# Patient Record
Sex: Female | Born: 1980 | Race: White | Hispanic: No | Marital: Married | Smoking: Former smoker
Health system: Southern US, Community
[De-identification: ages and names within clinical notes are randomized; demographics above are authoritative.]

## PROBLEM LIST (undated history)

## (undated) DIAGNOSIS — R87619 Unspecified abnormal cytological findings in specimens from cervix uteri: Secondary | ICD-10-CM

## (undated) DIAGNOSIS — R51 Headache: Secondary | ICD-10-CM

## (undated) DIAGNOSIS — F419 Anxiety disorder, unspecified: Secondary | ICD-10-CM

## (undated) DIAGNOSIS — J189 Pneumonia, unspecified organism: Secondary | ICD-10-CM

## (undated) DIAGNOSIS — I499 Cardiac arrhythmia, unspecified: Secondary | ICD-10-CM

## (undated) DIAGNOSIS — R519 Headache, unspecified: Secondary | ICD-10-CM

## (undated) HISTORY — DX: Unspecified abnormal cytological findings in specimens from cervix uteri: R87.619

---

## 2008-12-10 ENCOUNTER — Ambulatory Visit: Payer: Self-pay | Admitting: Unknown Physician Specialty

## 2009-12-10 ENCOUNTER — Observation Stay: Payer: Self-pay

## 2009-12-11 ENCOUNTER — Inpatient Hospital Stay: Payer: Self-pay | Admitting: Obstetrics and Gynecology

## 2012-03-07 ENCOUNTER — Ambulatory Visit: Payer: Self-pay | Admitting: Obstetrics and Gynecology

## 2012-06-17 ENCOUNTER — Ambulatory Visit: Payer: Self-pay | Admitting: Obstetrics and Gynecology

## 2012-06-17 LAB — CBC WITH DIFFERENTIAL/PLATELET
Basophil %: 0.3 %
Eosinophil #: 0.1 10*3/uL (ref 0.0–0.7)
Eosinophil %: 0.7 %
HCT: 36.4 % (ref 35.0–47.0)
HGB: 12.7 g/dL (ref 12.0–16.0)
MCHC: 34.9 g/dL (ref 32.0–36.0)
MCV: 94 fL (ref 80–100)
Neutrophil %: 80 %
Platelet: 169 10*3/uL (ref 150–440)
WBC: 9.6 10*3/uL (ref 3.6–11.0)

## 2012-06-18 ENCOUNTER — Inpatient Hospital Stay: Payer: Self-pay | Admitting: Obstetrics and Gynecology

## 2012-06-19 LAB — HEMATOCRIT: HCT: 28.3 % — ABNORMAL LOW (ref 35.0–47.0)

## 2013-03-28 ENCOUNTER — Ambulatory Visit: Payer: Self-pay | Admitting: Physician Assistant

## 2014-04-27 ENCOUNTER — Ambulatory Visit: Payer: Self-pay | Admitting: Obstetrics and Gynecology

## 2014-05-06 ENCOUNTER — Ambulatory Visit: Payer: Self-pay | Admitting: Obstetrics and Gynecology

## 2014-07-10 NOTE — Op Note (Signed)
PATIENT NAME:  Christie Cannon, Christie Cannon MR#:  [id number] DATE OF BIRTH:  05/16/1980  DATE OF SURGERY:  06/18/2012  PREOPERATIVE DIAGNOSES: 1.  Intrauterine pregnancy at 39 weeks' gestational age.  2.  History of perineal trauma, with fourth-degree laceration with prior delivery.  3.  Desires primary repeat cesarean section.   POSTOPERATIVE DIAGNOSES: 1.  Intrauterine pregnancy at [redacted] weeks gestational age.  2.  History of perineal trauma, with fourth-degree laceration with prior delivery.  3.  Desires primary repeat cesarean section.   PROCEDURES: Primary low transverse cesarean section via Pfannenstiel incision.   ANESTHESIA: Spinal.  SURGEON: Stephen Jackson, M.D.   ASSISTANT SURGEON: Paul Harris, M.D.   ESTIMATED BLOOD LOSS: 750 mL.   OPERATIVE FLUIDS: 1100 mL crystalloid.   COMPLICATIONS: None.   FINDINGS: 1.  Normal-appearing gravid uterus, fallopian tubes, and ovaries.  2.  Viable female infant with Apgars of 9 and 9 at 1 and 5 minutes, respectively.   SPECIMENS: None.   CONDITION AT THE END OF PROCEDURE: Stable.   INDICATIONS FOR PROCEDURE: The patient is a 34-year-old gravida 2, para 1-0-0-1 female at 39 weeks' gestational age who has a history of perineal trauma with a fourth-degree laceration with delivery of her first child. During her pregnancy, she was counseled regarding attempted vaginal delivery with her history of perineal trauma versus offering a primary cesarean section. After deliberation and much counseling, the patient elected to undergo a primary cesarean section. For this, she was taken to the operating room for abdominal delivery.   PROCEDURE IN DETAIL: The patient was met in the preoperative area and the procedure was reviewed and her questions were answered. She was taken to the operating room. She was placed under spinal anesthesia, which was found to be adequate. She was placed in the dorsal supine position with a leftward tilt and then prepped and draped in the  usual sterile fashion. After a time-out was called, a Pfannenstiel incision was made and carried through the various layers until the peritoneum was reached and entered sharply. After extending the peritoneal opening, a bladder flap was created and a bladder blade was placed to pull the bladder out of the operative area of interest. A low transverse incision was made on the uterus and extended laterally with both cranial and caudal tension, and the fetal vertex was then grasped and elevated to the hysterotomy. The bladder blade was removed and then the head, followed by the shoulders and the rest of the body were delivered without difficulty. The cord was then clamped and cut and the infant was handed off to the awaiting pediatrician. The cord blood was obtained. The placenta was then removed and the uterus was exteriorized and cleared of all clots and debris. The hysterotomy was closed using #0 Vicryl in a running, locked fashion. A second layer of the same suture was used to obtain hemostasis. The uterus was returned to the abdomen and the gutters were cleared of all clots and debris. After hemostasis was assured, the rectus muscles were reapproximated in the midline with a single, loose figure-of-eight suture.   Next, the On-Q pump pain system was then placed according to the manufacturer's recommendations. The 2 catheters were placed approximately 3.5 and 4 cm cephalad to the incision line, respectively. They were inserted to a level of about 3-1/2 marks along the catheter, and they were placed just superficial to the rectus abdominal muscles and just deep to the rectus fascia.   The fascia was then closed with #0 Maxon   using 2 sutures, each starting at the opposite apices and meeting in the midline, where they were tied off. Extra care was taken not to include the On-Q catheters in the closure of the fascia. The subcutaneous tissue was irrigated copiously and hemostasis was assured. The skin was  reapproximated using the Insorb subcutaneous stapler and then Steri-Strips as well as benzoin were placed to reinforce this closure.   Attention was turned to the On-Q pump where the catheters were affixed to the skin using Dermabond; and after this dried, the catheters were affixed to the skin using Steri-Strips as well as Tegaderm. Each catheter was bolused using 5 mL of 0.5% Marcaine plain for a total of 10 mL injected for bolus.   The patient tolerated the procedure well. Sponge, lap, and needle counts were correct x2. For an antibiotic, she received 2 grams of Ancef prior to skin incision. For VTE prophylaxis, the patient was wearing pneumatic compression stockings throughout the entire procedure, which were on and operating. She was taken to the recovery room in stable condition.      ____________________________ Stephen D. Jackson, MD sdj:dm D: 06/18/2012 10:30:00 ET T: 06/18/2012 10:44:14 ET JOB#: [id number]  cc: Stephen D. Jackson, MD, <Dictator> STEPHEN D JACKSON MD ELECTRONICALLY SIGNED 07/18/2012 8:23 

## 2014-11-19 LAB — HM PAP SMEAR: HM Pap smear: NEGATIVE

## 2015-04-01 ENCOUNTER — Encounter: Payer: Self-pay | Admitting: *Deleted

## 2015-04-14 ENCOUNTER — Ambulatory Visit (INDEPENDENT_AMBULATORY_CARE_PROVIDER_SITE_OTHER): Payer: BC Managed Care – PPO | Admitting: General Surgery

## 2015-04-14 ENCOUNTER — Encounter: Payer: Self-pay | Admitting: General Surgery

## 2015-04-14 VITALS — BP 100/60 | HR 88 | Resp 12 | Ht 66.0 in | Wt 159.0 lb

## 2015-04-14 DIAGNOSIS — K4090 Unilateral inguinal hernia, without obstruction or gangrene, not specified as recurrent: Secondary | ICD-10-CM | POA: Diagnosis not present

## 2015-04-14 DIAGNOSIS — Z331 Pregnant state, incidental: Secondary | ICD-10-CM

## 2015-04-14 DIAGNOSIS — Z3490 Encounter for supervision of normal pregnancy, unspecified, unspecified trimester: Secondary | ICD-10-CM

## 2015-04-14 NOTE — Progress Notes (Signed)
Patient ID: Christie Cannon, female   DOB: Jun 22, 1980, 35 y.o.   MRN: [id number]  Chief Complaint  Patient presents with  . Abdominal Pain    HPI Christie Cannon is a 35 y.o. female.  Here today for evaluation of left groin swelling since the first part of the month. She did notice some left abdominal pain since last year. CT abdomen was 05-06-14. The pain can last all day. Ice pack does help. She is currently [redacted] weeks pregnant. Tentative C-section is April 27th. She is here today with her husband. Bowels are regular and daily, she does notice occasionally diarrhea, lasting 2-3 days, but that has been for about a year and correlates to her menstrual cycle. The patient suffered a fourth degree tear with her first pregnancy. Elective C-section with the second. Elective C-section planned with the third.    I personally reviewed the patient's history.   HPI  No past medical history on file.  Past Surgical History  Procedure Laterality Date  . Cesarean section  2014    No family history on file.  Social History Social History  Substance Use Topics  . Smoking status: Former Smoker -- 1.00 packs/day for 5 years    Types: Cigarettes  . Smokeless tobacco: Never Used  . Alcohol Use: 0.0 oz/week    0 Standard drinks or equivalent per week    Allergies  Allergen Reactions  . Penicillins Rash    Current Outpatient Prescriptions  Medication Sig Dispense Refill  . Prenatal Multivit-Min-Fe-FA (PRE-NATAL PO) Take by mouth.     No current facility-administered medications for this visit.    Review of Systems Review of Systems  Constitutional: Negative.   Respiratory: Negative.   Cardiovascular: Negative.   Gastrointestinal: Positive for abdominal pain. Negative for constipation.    Blood pressure 100/60, pulse 88, resp. rate 12, height  (1.676 m), weight 159 lb (72.122 kg).  Physical Exam Physical Exam  Constitutional: She is oriented to person, place, and time. She appears  well-developed and well-nourished.  HENT:  Mouth/Throat: Oropharynx is clear and moist.  Eyes: Conjunctivae are normal. No scleral icterus.  Neck: Neck supple.  Cardiovascular: Normal rate, regular rhythm and normal heart sounds.   Pulses:      Femoral pulses are 2+ on the right side, and 2+ on the left side. Pulmonary/Chest: Effort normal and breath sounds normal.  Abdominal: A hernia is present. Hernia confirmed positive in the left inguinal area.    Tender left groin  Lymphadenopathy:    She has no cervical adenopathy.  Neurological: She is alert and oriented to person, place, and time.  Skin: Skin is warm and dry.  Psychiatric: Her behavior is normal.    Data Reviewed The February 2016 CT scan was independently reviewed. No left lower quadrant findings noted. No evidence of hernia at that time. REASON FOR EXAM:    LLQ Abd and Pelvic Pain Radiating to Back COMMENTS: PROCEDURE:     KCT - KCT ABDOMEN/PELVIS W  - May 06 2014  3:44PM CLINICAL DATA:  Intermittent left lower quadrant pain EXAM: CT ABDOMEN AND PELVIS WITH CONTRAST TECHNIQUE: Multidetector CT imaging of the abdomen and pelvis was performed using the standard protocol following bolus administration of intravenous contrast. CONTRAST:  85 cc of Omnipaque 300 COMPARISON:  None. FINDINGS: Lower chest: No pleural or pericardial effusion noted. The lung bases appear clear. Hepatobiliary: No suspicious liver abnormalities identified. The gallbladder appears normal. There is no biliary dilatation. Pancreas: Negative Spleen:  The spleen appears normal. Adrenals/Urinary Tract: The adrenal glands are normal. Normal appearance of the left kidney. The right kidney is also normal. Urinary bladder appears within normal limits. Stomach/Bowel: The stomach is within normal limits. The small bowel loops have a normal course and caliber. No obstruction. Normal appearance of the colon. The appendix is visualized and is  within normal limits. Vascular/Lymphatic: Normal appearance of the abdominal aorta. No enlarged retroperitoneal or mesenteric adenopathy. No enlarged pelvic or inguinal lymph nodes. Reproductive: The uterus appears normal. Corpus luteal cyst is identified in the left ovary. Other: There is no ascites or focal fluid collections within the abdomen or pelvis. Musculoskeletal: The visualized osseous structures are on unremarkable. IMPRESSION: 1. No acute findings. 2. No explanation for left lower quadrant pain and back pain.  Office notes of 04/01/2015 from Thomasene Mohair, M.D. Reviewed.     Assessment    Left inguinal hernia, mildly symptomatic.    Plan    Indications for elective repair were reviewed. While she is at the end of her second trimester, she is minimally symptomatic and as each week of her pregnancy goes on she is less likely have difficulty with bowel or omentum becoming trapped in the defect. I would like to avoid surgery while pregnant if possible.  If I am in town when her scheduled C-section is being planned, I would like to come in to the OR and assess the area. This may be a matter of a simple suture at the internal ring without mesh reinforcement. I would be reluctant to place mesh at the time of C-section.  If intraoperative assessment or repair during the C-section is not appropriate, would plan for a outpatient procedure 6-8 weeks after delivery.    Hernia precautions and incarceration were discussed with the patient. If they develop symptoms of an incarcerated hernia, they were encouraged to seek prompt medical attention.  I have recommended repair of the hernia using mesh on an outpatient basis in the near future. The risk of infection was reviewed. The role of prosthetic mesh to minimize the risk of recurrence was reviewed.    PCP:  No Pcp Per Patient Ref: Dr Thomasene Mohair  This information has been scribed by Dorathy Daft RNBC.   Earline Mayotte 04/15/2015, 4:48 PM

## 2015-04-14 NOTE — Patient Instructions (Signed)
The patient is aware to call back for any questions or concerns. Inguinal Hernia, Adult Muscles help keep everything in the body in its proper place. But if a weak spot in the muscles develops, something can poke through. That is called a hernia. When this happens in the lower part of the belly (abdomen), it is called an inguinal hernia. (It takes its name from a part of the body in this region called the inguinal canal.) A weak spot in the wall of muscles lets some fat or part of the small intestine bulge through. An inguinal hernia can develop at any age. Men get them more often than women. CAUSES  In adults, an inguinal hernia develops over time.  It can be triggered by:  Suddenly straining the muscles of the lower abdomen.  Lifting heavy objects.  Straining to have a bowel movement. Difficult bowel movements (constipation) can lead to this.  Constant coughing. This may be caused by smoking or lung disease.  Being overweight.  Being pregnant.  Working at a job that requires long periods of standing or heavy lifting.  Having had an inguinal hernia before. One type can be an emergency situation. It is called a strangulated inguinal hernia. It develops if part of the small intestine slips through the weak spot and cannot get back into the abdomen. The blood supply can be cut off. If that happens, part of the intestine may die. This situation requires emergency surgery. SYMPTOMS  Often, a small inguinal hernia has no symptoms. It is found when a healthcare provider does a physical exam. Larger hernias usually have symptoms.   In adults, symptoms may include:  A lump in the groin. This is easier to see when the person is standing. It might disappear when lying down.  In men, a lump in the scrotum.  Pain or burning in the groin. This occurs especially when lifting, straining or coughing.  A dull ache or feeling of pressure in the groin.  Signs of a strangulated hernia can  include:  A bulge in the groin that becomes very painful and tender to the touch.  A bulge that turns red or purple.  Fever, nausea and vomiting.  Inability to have a bowel movement or to pass gas. DIAGNOSIS  To decide if you have an inguinal hernia, a healthcare provider will probably do a physical examination.  This will include asking questions about any symptoms you have noticed.  The healthcare provider might feel the groin area and ask you to cough. If an inguinal hernia is felt, the healthcare provider may try to slide it back into the abdomen.  Usually no other tests are needed. TREATMENT  Treatments can vary. The size of the hernia makes a difference. Options include:  Watchful waiting. This is often suggested if the hernia is small and you have had no symptoms.  No medical procedure will be done unless symptoms develop.  You will need to watch closely for symptoms. If any occur, contact your healthcare provider right away.  Surgery. This is used if the hernia is larger or you have symptoms.  Open surgery. This is usually an outpatient procedure (you will not stay overnight in a hospital). An cut (incision) is made through the skin in the groin. The hernia is put back inside the abdomen. The weak area in the muscles is then repaired by herniorrhaphy or hernioplasty. Herniorrhaphy: in this type of surgery, the weak muscles are sewn back together. Hernioplasty: a patch or mesh is   used to close the weak area in the abdominal wall.  Laparoscopy. In this procedure, a surgeon makes small incisions. A thin tube with a tiny video camera (called a laparoscope) is put into the abdomen. The surgeon repairs the hernia with mesh by looking with the video camera and using two long instruments. HOME CARE INSTRUCTIONS   After surgery to repair an inguinal hernia:  You will need to take pain medicine prescribed by your healthcare provider. Follow all directions carefully.  You will need  to take care of the wound from the incision.  Your activity will be restricted for awhile. This will probably include no heavy lifting for several weeks. You also should not do anything too active for a few weeks. When you can return to work will depend on the type of job that you have.  During "watchful waiting" periods, you should:  Maintain a healthy weight.  Eat a diet high in fiber (fruits, vegetables and whole grains).  Drink plenty of fluids to avoid constipation. This means drinking enough water and other liquids to keep your urine clear or pale yellow.  Do not lift heavy objects.  Do not stand for long periods of time.  Quit smoking. This should keep you from developing a frequent cough. SEEK MEDICAL CARE IF:   A bulge develops in your groin area.  You feel pain, a burning sensation or pressure in the groin. This might be worse if you are lifting or straining.  You develop a fever of more than 100.5 F (38.1 C). SEEK IMMEDIATE MEDICAL CARE IF:   Pain in the groin increases suddenly.  A bulge in the groin gets bigger suddenly and does not go down.  For men, there is sudden pain in the scrotum. Or, the size of the scrotum increases.  A bulge in the groin area becomes red or purple and is painful to touch.  You have nausea or vomiting that does not go away.  You feel your heart beating much faster than normal.  You cannot have a bowel movement or pass gas.  You develop a fever of more than 102.0 F (38.9 C).   This information is not intended to replace advice given to you by your health care provider. Make sure you discuss any questions you have with your health care provider.   Document Released: 07/23/2008 Document Revised: 05/29/2011 Document Reviewed: 09/07/2014 Elsevier Interactive Patient Education 2016 [employer]  

## 2015-04-15 DIAGNOSIS — K469 Unspecified abdominal hernia without obstruction or gangrene: Secondary | ICD-10-CM | POA: Insufficient documentation

## 2015-04-15 DIAGNOSIS — Z3490 Encounter for supervision of normal pregnancy, unspecified, unspecified trimester: Secondary | ICD-10-CM | POA: Insufficient documentation

## 2015-07-14 ENCOUNTER — Encounter
Admission: RE | Admit: 2015-07-14 | Discharge: 2015-07-14 | Disposition: A | Discharge status: Home / Self Care | Payer: BC Managed Care – PPO | Source: Ambulatory Visit | Attending: Obstetrics and Gynecology | Admitting: Obstetrics and Gynecology

## 2015-07-14 LAB — RAPID HIV SCREEN (HIV 1/2 AB+AG)
HIV 1/2 Antibodies: NONREACTIVE
HIV-1 P24 ANTIGEN - HIV24: NONREACTIVE

## 2015-07-14 LAB — CBC
HCT: 37.2 % (ref 35.0–47.0)
HEMOGLOBIN: 13.1 g/dL (ref 12.0–16.0)
MCH: 31.7 pg (ref 26.0–34.0)
MCHC: 35.1 g/dL (ref 32.0–36.0)
MCV: 90.2 fL (ref 80.0–100.0)
PLATELETS: 169 10*3/uL (ref 150–440)
RBC: 4.12 MIL/uL (ref 3.80–5.20)
RDW: 13.5 % (ref 11.5–14.5)
WBC: 9.7 10*3/uL (ref 3.6–11.0)

## 2015-07-14 LAB — ABO/RH: ABO/RH(D): O POS

## 2015-07-14 LAB — TYPE AND SCREEN
ABO/RH(D): O POS
ANTIBODY SCREEN: NEGATIVE
EXTEND SAMPLE REASON: UNDETERMINED

## 2015-07-14 NOTE — Patient Instructions (Signed)
  Your procedure is scheduled on:07/15/15 5:30 AM Report to  [facility]   Remember: Instructions that are not followed completely may result in serious medical risk, up to and including death, or upon the discretion of your surgeon and anesthesiologist your surgery may need to be rescheduled.    ___X_ 1. Do not eat food or drink liquids after midnight. No gum chewing or hard candies.     __X__ 2. No Alcohol for 24 hours before or after surgery.   ____ 3. Bring all medications with you on the day of surgery if instructed.    __X__ 4. Notify your doctor if there is any change in your medical condition     (cold, fever, infections).     Do not wear jewelry, make-up, hairpins, clips or nail polish.  Do not wear lotions, powders, or perfumes. You may wear deodorant.  Do not shave 48 hours prior to surgery. Men may shave face and neck.  Do not bring valuables to the hospital.    [facility] is not responsible for any belongings or valuables.               Contacts, dentures or bridgework may not be worn into surgery.  Leave your suitcase in the car. After surgery it may be brought to your room.  For patients admitted to the hospital, discharge time is determined by your                treatment team.   Patients discharged the day of surgery will not be allowed to drive home.   X____ Take these medicines the morning of surgery with A SIP OF WATER:    1. NONE  2.   3.   4.  5.  6.  ____ Fleet Enema (as directed)   __X__ Use CHG Soap as directed  ____ Use inhalers on the day of surgery  ____ Stop metformin 2 days prior to surgery    ____ Take 1/2 of usual insulin dose the night before surgery and none on the morning of surgery.   ____ Stop Coumadin/Plavix/aspirin on   ____ Stop Anti-inflammatories on   ____ Stop supplements until after surgery.    ____ Bring C-Pap to the hospital.

## 2015-07-15 ENCOUNTER — Inpatient Hospital Stay: Payer: BC Managed Care – PPO | Admitting: Anesthesiology

## 2015-07-15 ENCOUNTER — Encounter
Admission: RE | Disposition: A | Discharge status: Home / Self Care | Payer: Self-pay | Source: Ambulatory Visit | Attending: Obstetrics and Gynecology

## 2015-07-15 ENCOUNTER — Inpatient Hospital Stay
Admission: RE | Admit: 2015-07-15 | Discharge: 2015-07-17 | DRG: 765 | Disposition: A | Discharge status: Home / Self Care | Payer: BC Managed Care – PPO | Source: Ambulatory Visit | Attending: Obstetrics and Gynecology | Admitting: Obstetrics and Gynecology

## 2015-07-15 DIAGNOSIS — Z3A39 39 weeks gestation of pregnancy: Secondary | ICD-10-CM | POA: Diagnosis not present

## 2015-07-15 DIAGNOSIS — Z98891 History of uterine scar from previous surgery: Secondary | ICD-10-CM

## 2015-07-15 DIAGNOSIS — O9081 Anemia of the puerperium: Secondary | ICD-10-CM | POA: Diagnosis not present

## 2015-07-15 DIAGNOSIS — O34211 Maternal care for low transverse scar from previous cesarean delivery: Secondary | ICD-10-CM | POA: Diagnosis present

## 2015-07-15 DIAGNOSIS — D62 Acute posthemorrhagic anemia: Secondary | ICD-10-CM | POA: Diagnosis not present

## 2015-07-15 LAB — OB RESULTS CONSOLE GBS: STREP GROUP B AG: POSITIVE

## 2015-07-15 LAB — RPR: RPR Ser Ql: NONREACTIVE

## 2015-07-15 SURGERY — Surgical Case
Anesthesia: Spinal

## 2015-07-15 MED ORDER — PHENYLEPHRINE HCL 10 MG/ML IJ SOLN
INTRAMUSCULAR | Status: DC | PRN
  Administered 2015-07-15 (×2): 100 ug via INTRAVENOUS

## 2015-07-15 MED ORDER — DIPHENHYDRAMINE HCL 25 MG PO CAPS: 25.0000 mg | ORAL_CAPSULE | ORAL | Status: DC | PRN

## 2015-07-15 MED ORDER — OXYCODONE HCL 5 MG/5ML PO SOLN: 5.0000 mg | Freq: Once | ORAL | Status: DC | PRN

## 2015-07-15 MED ORDER — SENNOSIDES-DOCUSATE SODIUM 8.6-50 MG PO TABS
2.0000 | ORAL_TABLET | ORAL | Status: DC
  Administered 2015-07-15 – 2015-07-17 (×2): 2 via ORAL
  Filled 2015-07-15 (×2): qty 2

## 2015-07-15 MED ORDER — OXYTOCIN 10 UNIT/ML IJ SOLN
2.5000 [IU]/h | INTRAVENOUS | Status: DC
  Filled 2015-07-15: qty 4

## 2015-07-15 MED ORDER — BUPIVACAINE HCL 0.5 % IJ SOLN
INTRAMUSCULAR | Status: DC | PRN
  Administered 2015-07-15: 10 mL

## 2015-07-15 MED ORDER — ACETAMINOPHEN 500 MG PO TABS
1000.0000 mg | ORAL_TABLET | Freq: Four times a day (QID) | ORAL | Status: AC
  Administered 2015-07-15 – 2015-07-16 (×4): 1000 mg via ORAL
  Filled 2015-07-15 (×5): qty 2

## 2015-07-15 MED ORDER — SODIUM CHLORIDE 0.9% FLUSH: 3.0000 mL | INTRAVENOUS | Status: DC | PRN

## 2015-07-15 MED ORDER — COCONUT OIL OIL
1.0000 "application " | TOPICAL_OIL | Status: DC | PRN
  Administered 2015-07-16: 1 via TOPICAL
  Filled 2015-07-15: qty 120

## 2015-07-15 MED ORDER — BUPIVACAINE HCL (PF) 0.5 % IJ SOLN
5.0000 mL | Freq: Once | INTRAMUSCULAR | Status: DC
  Filled 2015-07-15: qty 30

## 2015-07-15 MED ORDER — ONDANSETRON HCL 4 MG/2ML IJ SOLN
INTRAMUSCULAR | Status: DC | PRN
  Administered 2015-07-15: 8 mg via INTRAVENOUS

## 2015-07-15 MED ORDER — IBUPROFEN 600 MG PO TABS
600.0000 mg | ORAL_TABLET | Freq: Four times a day (QID) | ORAL | Status: DC
  Administered 2015-07-15: 600 mg via ORAL

## 2015-07-15 MED ORDER — KETOROLAC TROMETHAMINE 30 MG/ML IJ SOLN: 30.0000 mg | Freq: Four times a day (QID) | INTRAMUSCULAR | Status: DC | PRN

## 2015-07-15 MED ORDER — OXYTOCIN 40 UNITS IN LACTATED RINGERS INFUSION - SIMPLE MED
INTRAVENOUS | Status: DC | PRN
  Administered 2015-07-15: 500 mL via INTRAVENOUS

## 2015-07-15 MED ORDER — LACTATED RINGERS IV BOLUS (SEPSIS)
1000.0000 mL | Freq: Once | INTRAVENOUS | Status: AC
  Administered 2015-07-15: 1000 mL via INTRAVENOUS

## 2015-07-15 MED ORDER — BUPIVACAINE HCL (PF) 0.5 % IJ SOLN: 5.0000 mL | Freq: Once | INTRAMUSCULAR | Status: DC

## 2015-07-15 MED ORDER — SIMETHICONE 80 MG PO CHEW
80.0000 mg | CHEWABLE_TABLET | Freq: Three times a day (TID) | ORAL | Status: DC
  Administered 2015-07-15 – 2015-07-17 (×7): 80 mg via ORAL
  Filled 2015-07-15 (×7): qty 1

## 2015-07-15 MED ORDER — FENTANYL CITRATE (PF) 100 MCG/2ML IJ SOLN: 25.0000 ug | INTRAMUSCULAR | Status: DC | PRN

## 2015-07-15 MED ORDER — FERROUS SULFATE 325 (65 FE) MG PO TABS
325.0000 mg | ORAL_TABLET | Freq: Two times a day (BID) | ORAL | Status: DC
  Administered 2015-07-15 – 2015-07-17 (×4): 325 mg via ORAL
  Filled 2015-07-15 (×4): qty 1

## 2015-07-15 MED ORDER — DIBUCAINE 1 % RE OINT: 1.0000 "application " | TOPICAL_OINTMENT | RECTAL | Status: DC | PRN

## 2015-07-15 MED ORDER — CEFAZOLIN SODIUM-DEXTROSE 2-4 GM/100ML-% IV SOLN
2.0000 g | INTRAVENOUS | Status: AC
  Administered 2015-07-15: 2 g via INTRAVENOUS
  Filled 2015-07-15: qty 100

## 2015-07-15 MED ORDER — OXYCODONE-ACETAMINOPHEN 5-325 MG PO TABS: 1.0000 | ORAL_TABLET | ORAL | Status: DC | PRN

## 2015-07-15 MED ORDER — DIPHENHYDRAMINE HCL 50 MG/ML IJ SOLN
12.5000 mg | INTRAMUSCULAR | Status: DC | PRN
Start: 2015-07-15 — End: 2015-07-17

## 2015-07-15 MED ORDER — EPHEDRINE SULFATE 50 MG/ML IJ SOLN
INTRAMUSCULAR | Status: DC | PRN
  Administered 2015-07-15 (×3): 5 mg via INTRAVENOUS
  Administered 2015-07-15: 10 mg via INTRAVENOUS

## 2015-07-15 MED ORDER — FENTANYL CITRATE (PF) 100 MCG/2ML IJ SOLN
INTRAMUSCULAR | Status: DC | PRN
  Administered 2015-07-15: 15 ug via INTRATHECAL

## 2015-07-15 MED ORDER — PRENATAL MULTIVITAMIN CH
1.0000 | ORAL_TABLET | Freq: Every day | ORAL | Status: DC
  Administered 2015-07-15 – 2015-07-17 (×3): 1 via ORAL
  Filled 2015-07-15 (×4): qty 1

## 2015-07-15 MED ORDER — BUPIVACAINE 0.25 % ON-Q PUMP DUAL CATH 400 ML
400.0000 mL | INJECTION | Status: DC
  Filled 2015-07-15: qty 400

## 2015-07-15 MED ORDER — CITRIC ACID-SODIUM CITRATE 334-500 MG/5ML PO SOLN
30.0000 mL | ORAL | Status: AC
  Administered 2015-07-15: 30 mL via ORAL
  Filled 2015-07-15: qty 15

## 2015-07-15 MED ORDER — BUPIVACAINE IN DEXTROSE 0.75-8.25 % IT SOLN
INTRATHECAL | Status: DC | PRN
  Administered 2015-07-15: 1.8 mL via INTRATHECAL

## 2015-07-15 MED ORDER — LACTATED RINGERS IV SOLN
INTRAVENOUS | Status: DC
  Administered 2015-07-15: 12:00:00 via INTRAVENOUS

## 2015-07-15 MED ORDER — NALBUPHINE HCL 10 MG/ML IJ SOLN
5.0000 mg | Freq: Once | INTRAMUSCULAR | Status: DC | PRN
Start: 2015-07-15 — End: 2015-07-15

## 2015-07-15 MED ORDER — LACTATED RINGERS IV SOLN
INTRAVENOUS | Status: DC
  Administered 2015-07-15: 07:00:00 via INTRAVENOUS

## 2015-07-15 MED ORDER — BUPIVACAINE ON-Q PAIN PUMP (FOR ORDER SET NO CHG)
INJECTION | Status: DC
  Filled 2015-07-15: qty 1

## 2015-07-15 MED ORDER — NALBUPHINE HCL 10 MG/ML IJ SOLN: 5.0000 mg | INTRAMUSCULAR | Status: DC | PRN

## 2015-07-15 MED ORDER — NALOXONE HCL 0.4 MG/ML IJ SOLN: 0.4000 mg | INTRAMUSCULAR | Status: DC | PRN

## 2015-07-15 MED ORDER — WITCH HAZEL-GLYCERIN EX PADS: 1.0000 "application " | MEDICATED_PAD | CUTANEOUS | Status: DC | PRN

## 2015-07-15 MED ORDER — KETOROLAC TROMETHAMINE 30 MG/ML IJ SOLN
30.0000 mg | Freq: Four times a day (QID) | INTRAMUSCULAR | Status: DC | PRN
  Administered 2015-07-15: 30 mg via INTRAVENOUS
  Filled 2015-07-15: qty 1

## 2015-07-15 MED ORDER — DIPHENHYDRAMINE HCL 25 MG PO CAPS: 25.0000 mg | ORAL_CAPSULE | Freq: Four times a day (QID) | ORAL | Status: DC | PRN

## 2015-07-15 MED ORDER — OXYCODONE HCL 5 MG PO TABS: 5.0000 mg | ORAL_TABLET | Freq: Once | ORAL | Status: DC | PRN

## 2015-07-15 MED ORDER — ONDANSETRON HCL 4 MG/2ML IJ SOLN: 4.0000 mg | Freq: Three times a day (TID) | INTRAMUSCULAR | Status: DC | PRN

## 2015-07-15 MED ORDER — NALOXONE HCL 2 MG/2ML IJ SOSY
1.0000 ug/kg/h | PREFILLED_SYRINGE | INTRAMUSCULAR | Status: DC | PRN
  Filled 2015-07-15: qty 2

## 2015-07-15 MED ORDER — OXYCODONE-ACETAMINOPHEN 5-325 MG PO TABS
2.0000 | ORAL_TABLET | ORAL | Status: DC | PRN
  Filled 2015-07-15: qty 2

## 2015-07-15 MED ORDER — MENTHOL 3 MG MT LOZG: 1.0000 | LOZENGE | OROMUCOSAL | Status: DC | PRN

## 2015-07-15 MED ORDER — MORPHINE SULFATE (PF) 0.5 MG/ML IJ SOLN
INTRAMUSCULAR | Status: DC | PRN
  Administered 2015-07-15: .1 mg via INTRATHECAL

## 2015-07-15 SURGICAL SUPPLY — 30 items
CANISTER SUCT 3000ML (MISCELLANEOUS) ×3 IMPLANT
CATH KIT ON-Q SILVERSOAK 5IN (CATHETERS) ×6 IMPLANT
CLOSURE WOUND 1/2 X4 (GAUZE/BANDAGES/DRESSINGS)
DRSG TELFA 3X8 NADH (GAUZE/BANDAGES/DRESSINGS) ×3 IMPLANT
ELECT CAUTERY BLADE 6.4 (BLADE) ×12 IMPLANT
ELECT REM PT RETURN 9FT ADLT (ELECTROSURGICAL) ×3
ELECTRODE REM PT RTRN 9FT ADLT (ELECTROSURGICAL) ×1 IMPLANT
GAUZE SPONGE 4X4 12PLY STRL (GAUZE/BANDAGES/DRESSINGS) ×3 IMPLANT
GLOVE BIO SURGEON STRL SZ7 (GLOVE) ×3 IMPLANT
GLOVE INDICATOR 7.5 STRL GRN (GLOVE) ×12 IMPLANT
GOWN STRL REUS W/ TWL LRG LVL3 (GOWN DISPOSABLE) ×3 IMPLANT
GOWN STRL REUS W/TWL LRG LVL3 (GOWN DISPOSABLE) ×6
LIQUID BAND (GAUZE/BANDAGES/DRESSINGS) ×3 IMPLANT
NS IRRIG 1000ML POUR BTL (IV SOLUTION) ×3 IMPLANT
PACK C SECTION AR (MISCELLANEOUS) ×3 IMPLANT
PAD OB MATERNITY 4.3X12.25 (PERSONAL CARE ITEMS) ×6 IMPLANT
PAD PREP 24X41 OB/GYN DISP (PERSONAL CARE ITEMS) ×3 IMPLANT
SPONGE LAP 18X18 5 PK (GAUZE/BANDAGES/DRESSINGS) IMPLANT
STRIP CLOSURE SKIN 1/2X4 (GAUZE/BANDAGES/DRESSINGS) IMPLANT
SUT CHROMIC GUT BROWN 0 54 (SUTURE) IMPLANT
SUT CHROMIC GUT BROWN 0 54IN (SUTURE)
SUT MNCRL 4-0 (SUTURE) ×2
SUT MNCRL 4-0 27XMFL (SUTURE) ×1
SUT PDS AB 1 TP1 96 (SUTURE) ×3 IMPLANT
SUT PLAIN 2 0 XLH (SUTURE) IMPLANT
SUT VIC AB 0 CT1 36 (SUTURE) ×12 IMPLANT
SUT VIC AB 3-0 SH 27 (SUTURE) ×2
SUT VIC AB 3-0 SH 27X BRD (SUTURE) ×1 IMPLANT
SUTURE MNCRL 4-0 27XMF (SUTURE) ×1 IMPLANT
SWABSTK COMLB BENZOIN TINCTURE (MISCELLANEOUS) IMPLANT

## 2015-07-15 NOTE — Anesthesia Procedure Notes (Signed)
Spinal Patient location during procedure: OR Start time: 07/15/2015 7:43 AM End time: 07/15/2015 7:45 AM Staffing Resident/CRNA: Rashena Dowling Performed by: resident/CRNA  Preanesthetic Checklist Completed: patient identified, site marked, surgical consent, pre-op evaluation, timeout performed, IV checked, risks and benefits discussed and monitors and equipment checked Spinal Block Patient position: sitting Prep: Betadine Patient monitoring: heart rate, continuous pulse ox, blood pressure and cardiac monitor Approach: midline Location: L4-5 Injection technique: single-shot Needle Needle type: Whitacre and Introducer  Needle gauge: 24 G Needle length: 9 cm Assessment Sensory level: T4 Additional Notes Negative paresthesia. Negative blood return. Positive free-flowing CSF. Expiration date of kit checked and confirmed. Patient tolerated procedure well, without complications.

## 2015-07-15 NOTE — Op Note (Signed)
Cesarean Section Procedure Note   Christie Cannon   07/15/2015   Pre-operative Diagnosis:  1) intrauterine pregnancy at [redacted]w[redacted]d  2) history of cesarean section, desires repeat  Post-operative Diagnosis:  1) intrauterine pregnancy at [redacted]w[redacted]d  2) history of cesarean section, desires repeat   Procedure: repeat low transverse cesarean section via Pfannenstiel incision with double-layer uterine closure  Surgeon: Surgeon(s) and Role:    * Conard Novak, MD - Primary    * Elenora Fender Ward, MD - Assisting   Anesthesia: spinal   Findings:  1) normal appearing gravid uterus, fallopian tubes, and ovaries 2) fascial defect along the internal inguinal canal, about 3cm with no evidence of herniation of tissue   Estimated Blood Loss: 600 mL  Total IV Fluids: 1,100 ml   Specimens: None  Complications: no complications  Disposition: PACU - hemodynamically stable.   Maternal Condition: stable   Baby condition / location:  Couplet care / Skin to Skin  Procedure Details:  The patient was seen in the Holding Room. The risks, benefits, complications, treatment options, and expected outcomes were discussed with the patient. The patient concurred with the proposed plan, giving informed consent. identified as Christie Cannon and the procedure verified as C-Section Delivery. A Time Out was held and the above information confirmed.   After induction of anesthesia, the patient was prepped and draped in the usual sterile manner. A Pfannenstiel incision was made and carried down through the subcutaneous tissue to the fascia. Fascial incision was made and extended transversely. The fascia was separated from the underlying rectus tissue superiorly and inferiorly. The peritoneum was identified and entered. Peritoneal incision was extended longitudinally. The bladder flap was sharply freed from the lower uterine segment. A low transverse uterine incision was made and the hysterotomy was extended with  cranial-caudal tension. Delivered from cephalic presentation was a 3,870 gram Living newborn infant(s) or Female with Apgar scores of 8 at one minute and 9 at five minutes. Cord ph was not sent the umbilical cord was clamped and cut cord blood was obtained for evaluation. The placenta was removed Intact and appeared normal. The uterine outline, tubes and ovaries appeared normal. The uterine incision was closed with running locked sutures of 0 Vicryl.  A second layer of the same suture was thrown in an imbricating fashion.  Hemostasis was assured.  The uterus was returned to the abdomen and the paracolic gutters were cleared of all clots and debris.  The rectus muscles were inspected and found to be hemostatic.  The On-Q catheter pumps were inserted in accordance with the manufacturer's recommendations.  The catheters were inserted approximately 4cm cephelad to the incision line, approximately 1cm apart, straddling the midline.  They were inserted to a depth of the 4th mark. They were positioned superficial to the rectus abdominus muscles and deep to the rectus fascia.    The fascia was then reapproximated with running sutures of 1-0 PDS, looped. Three interrupted 3-0 Vircryl stitches were thrown in the subcutaneous tissue to reduce tension on the skin closure.  The subcuticular closure was performed using 4-0 monocryl. The skin closure was reinforced using surgical skin glue.  The On-Q catheters were bolused with 5 mL of 0.5% marcaine plain for a total of 10 mL.  The catheters were affixed to the skin with surgical skin glue, steri-strips, and tegaderm.    Instrument, sponge, and needle counts were correct prior the abdominal closure and were correct at the conclusion of the case.  The  patient received Ancef 2 gram IV prior to skin incision (within 30 minutes). For VTE prophylaxis she was wearing SCDs throughout the case.   Signed: Conard NovakStephen D. Kester Stimpson, MD 07/15/2015 9:00 AM

## 2015-07-15 NOTE — H&P (Signed)
History and Physical Interval Note:  Christie Cannon  has presented today for surgery, with the diagnosis of prior csection  The various methods of treatment have been discussed with the patient and family. After consideration of risks, benefits and other options for treatment, the patient has consented to  Procedure(s): CESAREAN SECTION (N/A) as a surgical intervention .  The patient's history has been reviewed, patient examined, no change in status, stable for surgery.  I have reviewed the patient's chart and labs.  Questions were answered to the patient's satisfaction.    The patient does not take a beta blocker and one is not indicated for this surgery.  Conard NovakJackson, Stephen D, MD 07/15/2015 7:31 AM

## 2015-07-15 NOTE — Anesthesia Preprocedure Evaluation (Signed)
Anesthesia Evaluation  Patient identified by MRN, date of birth, ID band Patient awake    Reviewed: Allergy & Precautions, H&P , NPO status , Patient's Chart, lab work & pertinent test results  History of Anesthesia Complications Negative for: history of anesthetic complications  Airway Mallampati: II  TM Distance: >3 FB Neck ROM: full    Dental  (+) Teeth Intact   Pulmonary neg shortness of breath, former smoker,    Pulmonary exam normal breath sounds clear to auscultation       Cardiovascular Exercise Tolerance: Good (-) angina(-) Past MI and (-) DOE negative cardio ROS Normal cardiovascular exam Rhythm:regular Rate:Normal     Neuro/Psych    GI/Hepatic negative GI ROS,   Endo/Other    Renal/GU   negative genitourinary   Musculoskeletal   Abdominal   Peds  Hematology negative hematology ROS (+)   Anesthesia Other Findings Past Medical History:   Medical history non-contributory                            Past Surgical History:   CESAREAN SECTION                                 2014        BMI    Body Mass Index   27.77 kg/m 2      Reproductive/Obstetrics (+) Pregnancy                             Anesthesia Physical Anesthesia Plan  ASA: II  Anesthesia Plan: Spinal   Post-op Pain Management:    Induction:   Airway Management Planned:   Additional Equipment:   Intra-op Plan:   Post-operative Plan:   Informed Consent: I have reviewed the patients History and Physical, chart, labs and discussed the procedure including the risks, benefits and alternatives for the proposed anesthesia with the patient or authorized representative who has indicated his/her understanding and acceptance.     Plan Discussed with: Anesthesiologist, CRNA and Surgeon  Anesthesia Plan Comments:         Anesthesia Quick Evaluation

## 2015-07-15 NOTE — Transfer of Care (Signed)
Immediate Anesthesia Transfer of Care Note  Patient: Christie Cannon  Procedure(s) Performed: Procedure(s): CESAREAN SECTION (N/A)  Patient Location: PACU  Anesthesia Type:Spinal  Level of Consciousness: awake, alert , oriented and patient cooperative  Airway & Oxygen Therapy: Patient Spontanous Breathing  Post-op Assessment: Report given to RN, Post -op Vital signs reviewed and stable and Patient moving all extremities X 4  Post vital signs: Reviewed and stable  Last Vitals:  Filed Vitals:   07/15/15 0552  BP: 104/69  Pulse: 81  Temp: 36.8 C  Resp: 15    Last Pain: There were no vitals filed for this visit.       Complications: No apparent anesthesia complications

## 2015-07-15 NOTE — Discharge Summary (Signed)
OB Discharge Summary  Patient Name: Christie Cannon DOB: 1980-08-18 MRN: [id number]  Date of admission: 07/15/2015 Delivering MD: Thomasene MohairStephen Jackson, MD Date of Delivery: 07/15/2015  Date of discharge: 07/17/15  Admitting diagnosis:  1) intrauterine pregnancy at 8115w2d  2) history of prior csection   Intrauterine pregnancy: 4815w2d      Secondary diagnosis: None     Discharge diagnosis: Term Pregnancy Delivered                                                                                                Post partum procedures:none  Augmentation: N/A  Complications: None  Hospital course:  Sceduled C/S   35 y.o. yo G3P1001 at 5215w2d was admitted to the hospital 07/15/2015 for scheduled cesarean section with the following indication:Elective Repeat.  Membrane Rupture Time/Date:   ,    Patient delivered a Viable infant.07/15/2015  Details of operation can be found in separate operative note.  Pateint had an uncomplicated postpartum course.  She is ambulating, tolerating a regular diet, passing flatus, and urinating well. Patient is discharged home in stable condition on  07/15/2015          Physical exam  Filed Vitals:   07/15/15 0552  BP: 104/69  Pulse: 81  Temp: 98.3 F (36.8 C)  TempSrc: Oral  Resp: 15  Height: 5\' 6"  (1.676 m)  Weight: 78.019 kg (172 lb)   General: alert, cooperative and no distress Lochia: appropriate Uterine Fundus: firm Incision: Healing well with no significant drainage, No significant erythema, Dressing is clean, dry, and intact DVT Evaluation: No evidence of DVT seen on physical exam.  Labs: Lab Results  Component Value Date   WBC 9.7 07/14/2015   HGB 13.1 07/14/2015   HCT 37.2 07/14/2015   MCV 90.2 07/14/2015   PLT 169 07/14/2015   No flowsheet data found.  PP HCt:  34.6  Discharge instruction:   Call office if you have any of the following: headache, visual changes, fever >100 F, chills, breast concerns, excessive vaginal bleeding,  incision drainage or problems, leg pain or redness, depression or any other concerns.   Activity: Do not lift > 10 lbs for 6 weeks.  No intercourse or tampons for 6 weeks.  No driving for 1-2 weeks.    Medications:    Medication List    TAKE these medications        ibuprofen 600 MG tablet  Commonly known as:  ADVIL,MOTRIN  Take 1 tablet (600 mg total) by mouth every 6 (six) hours.     PRE-NATAL PO  Take by mouth.         Diet: routine diet  Activity: Advance as tolerated. Pelvic rest for 6 weeks.   Outpatient follow up: No Follow-up on file.  Postpartum contraception: Condoms Rhogam Given postpartum: no Rubella vaccine given postpartum: n/a Varicella vaccine given postpartum: no TDaP given antepartum or postpartum: TDaP AP Flu vaccine given antepartum or postpartum: yes AP  Newborn Data: Live born female  Birth Weight: 8 lb 8.5 oz (3870 g) APGAR: 8, 9   Baby Feeding: Breast  Disposition:home with mother

## 2015-07-15 NOTE — Lactation Note (Signed)
This note was copied from a baby's chart. Lactation Consultation Note  Patient Name: Christie Cannon ZOXWR'UToday's Date: 07/15/2015 Reason for consult: Initial assessment   Maternal Data Formula Feeding for Exclusion: No Does the patient have breastfeeding experience prior to this delivery?: Yes  Feeding Feeding Type: Breast Fed  LATCH Score/Interventions Latch: Grasps breast easily, tongue down, lips flanged, rhythmical sucking.  Audible Swallowing: Spontaneous and intermittent  Type of Nipple: Everted at rest and after stimulation  Comfort (Breast/Nipple): Soft / non-tender     Hold (Positioning): No assistance needed to correctly position infant at breast.  LATCH Score: 10            Gilman SchmidtCarolyn P Kiwana Deblasi 07/15/2015, 11:43 AM

## 2015-07-16 LAB — CBC
HCT: 34.6 % — ABNORMAL LOW (ref 35.0–47.0)
Hemoglobin: 12 g/dL (ref 12.0–16.0)
MCH: 32 pg (ref 26.0–34.0)
MCHC: 34.6 g/dL (ref 32.0–36.0)
MCV: 92.7 fL (ref 80.0–100.0)
PLATELETS: 141 10*3/uL — AB (ref 150–440)
RBC: 3.73 MIL/uL — AB (ref 3.80–5.20)
RDW: 13.6 % (ref 11.5–14.5)
WBC: 10.1 10*3/uL (ref 3.6–11.0)

## 2015-07-16 MED ORDER — IBUPROFEN 600 MG PO TABS
600.0000 mg | ORAL_TABLET | Freq: Four times a day (QID) | ORAL | Status: DC
  Administered 2015-07-16: 600 mg via ORAL

## 2015-07-16 MED ORDER — ACETAMINOPHEN 325 MG PO TABS
650.0000 mg | ORAL_TABLET | Freq: Four times a day (QID) | ORAL | Status: DC | PRN
  Administered 2015-07-16 – 2015-07-17 (×4): 650 mg via ORAL
  Filled 2015-07-16 (×4): qty 2

## 2015-07-16 MED ORDER — IBUPROFEN 600 MG PO TABS
600.0000 mg | ORAL_TABLET | Freq: Four times a day (QID) | ORAL | Status: DC
  Administered 2015-07-16 – 2015-07-17 (×5): 600 mg via ORAL
  Filled 2015-07-16 (×4): qty 1
  Filled 2015-07-16: qty 2
  Filled 2015-07-16: qty 1

## 2015-07-16 NOTE — Anesthesia Post-op Follow-up Note (Signed)
  Anesthesia Pain Follow-up Note  Patient: Christie Cannon  Day #: 1  Date of Follow-up: 07/16/2015 Time: 7:25 AM  Last Vitals:  Filed Vitals:   07/16/15 0057 07/16/15 0439  BP: 90/52 96/57  Pulse: 63 58  Temp: 36.8 C 36.9 C  Resp: 20 18    Level of Consciousness: alert  Pain: none   Side Effects:None  Catheter Site Exam:clean  Plan: D/C from anesthesia care  [facility] Irelynn Schermerhorn

## 2015-07-16 NOTE — Anesthesia Postprocedure Evaluation (Signed)
Anesthesia Post Note  Patient: Christie Cannon  Procedure(s) Performed: Procedure(s) (LRB): CESAREAN SECTION (N/A)  Patient location during evaluation: Mother Baby Anesthesia Type: Spinal Level of consciousness: awake, oriented and awake and alert Pain management: pain level controlled Vital Signs Assessment: post-procedure vital signs reviewed and stable Respiratory status: spontaneous breathing, nonlabored ventilation and respiratory function stable Cardiovascular status: stable Anesthetic complications: no    Last Vitals:  Filed Vitals:   07/16/15 0057 07/16/15 0439  BP: 90/52 96/57  Pulse: 63 58  Temp: 36.8 C 36.9 C  Resp: 20 18    Last Pain:  Filed Vitals:   07/16/15 0439  PainSc: 2                  [name]

## 2015-07-16 NOTE — Progress Notes (Signed)
I have reviewed all charting done by Andrea Cox (student RN).Nicholis Stepanek RN 

## 2015-07-16 NOTE — Progress Notes (Signed)
Post Op Day 1 Subjective:   Pt is doing well, ambulating and voiding without difficulty, passing gas, tolerating PO intake and pain is well controlled with PO pain medicine.   Objective:  Blood pressure 110/66, pulse 61, temperature 97.5 F (36.4 C), temperature source Oral, resp. rate 18, height 5\' 6"  (1.676 m), weight 78.019 kg (172 lb), last menstrual period 10/13/2014, SpO2 100 %, breastfeeding.  General: NAD Pulmonary: no increased work of breathing Abdomen: non-distended, non-tender, fundus firm at level of umbilicus Lochia: appropriate amount Incision: C/D/I no s/s of infection Extremities: no edema, no erythema, no tenderness  Results for orders placed or performed during the hospital encounter of 07/15/15 (from the past 24 hour(s))  CBC     Status: Abnormal   Collection Time: 07/16/15  5:56 AM  Result Value Ref Range   WBC 10.1 3.6 - 11.0 K/uL   RBC 3.73 (L) 3.80 - 5.20 MIL/uL   Hemoglobin 12.0 12.0 - 16.0 g/dL   HCT 28.434.6 (L) 13.235.0 - 44.047.0 %   MCV 92.7 80.0 - 100.0 fL   MCH 32.0 26.0 - 34.0 pg   MCHC 34.6 32.0 - 36.0 g/dL   RDW 10.213.6 72.511.5 - 36.614.5 %   Platelets 141 (L) 150 - 440 K/uL     Assessment:   35 y.o. G3P1001 postoperativeday # 1   Plan:  1) Acute blood loss anemia - hemodynamically stable and asymptomatic - po ferrous sulfate  2) O+, Rubella Immune, Varicella Immune  3) TDAP UTD  4) Breast//Contraception: Condoms  5) Disposition: Home day 2 or 3

## 2015-07-17 MED ORDER — IBUPROFEN 600 MG PO TABS: 600.0000 mg | ORAL_TABLET | Freq: Four times a day (QID) | ORAL | Status: DC

## 2015-07-17 NOTE — Progress Notes (Signed)
Patient understands all discharge instructions and the need to make follow up appointments. Patient discharge via wheelchair with RN. 

## 2015-07-17 NOTE — Discharge Instructions (Signed)
Discharge instructions:  ° °Call office if you have any of the following: headache, visual changes, fever >100 F, chills, breast concerns, excessive vaginal bleeding, incision drainage or problems, leg pain or redness, depression or any other concerns.  ° °Activity: Do not lift > 10 lbs for 6 weeks.  °No intercourse or tampons for 6 weeks.  °No driving for 1-2 weeks.  ° °

## 2016-06-30 ENCOUNTER — Telehealth: Payer: Self-pay

## 2016-06-30 DIAGNOSIS — F3281 Premenstrual dysphoric disorder: Secondary | ICD-10-CM

## 2016-06-30 NOTE — Telephone Encounter (Signed)
Pt calling today stating that she has an appt 08/09/2016 for medication follow up but will need one more refill on Lexapro to last to that appt. fwding to [facility] for this, pt aware via vm.

## 2016-07-03 DIAGNOSIS — F3281 Premenstrual dysphoric disorder: Secondary | ICD-10-CM | POA: Insufficient documentation

## 2016-07-03 MED ORDER — ESCITALOPRAM OXALATE 5 MG PO TABS: 5.0000 mg | ORAL_TABLET | Freq: Every day | ORAL | 1 refills | Status: DC

## 2016-07-03 NOTE — Addendum Note (Signed)
Addended by: Thomasene Mohair D on: 07/03/2016 01:21 PM   Modules accepted: Orders

## 2016-08-09 ENCOUNTER — Encounter: Payer: Self-pay | Admitting: Obstetrics and Gynecology

## 2016-08-09 ENCOUNTER — Ambulatory Visit (INDEPENDENT_AMBULATORY_CARE_PROVIDER_SITE_OTHER): Payer: BC Managed Care – PPO | Admitting: Obstetrics and Gynecology

## 2016-08-09 VITALS — BP 118/70 | Ht 66.0 in | Wt 146.0 lb

## 2016-08-09 DIAGNOSIS — T8332XA Displacement of intrauterine contraceptive device, initial encounter: Secondary | ICD-10-CM | POA: Insufficient documentation

## 2016-08-09 DIAGNOSIS — F3281 Premenstrual dysphoric disorder: Secondary | ICD-10-CM | POA: Diagnosis not present

## 2016-08-09 DIAGNOSIS — K469 Unspecified abdominal hernia without obstruction or gangrene: Secondary | ICD-10-CM | POA: Diagnosis not present

## 2016-08-09 DIAGNOSIS — Z01419 Encounter for gynecological examination (general) (routine) without abnormal findings: Secondary | ICD-10-CM | POA: Diagnosis not present

## 2016-08-09 DIAGNOSIS — Z1339 Encounter for screening examination for other mental health and behavioral disorders: Secondary | ICD-10-CM

## 2016-08-09 DIAGNOSIS — Z1389 Encounter for screening for other disorder: Secondary | ICD-10-CM | POA: Diagnosis not present

## 2016-08-09 MED ORDER — ESCITALOPRAM OXALATE 10 MG PO TABS: 10.0000 mg | ORAL_TABLET | Freq: Every day | ORAL | 2 refills | Status: DC

## 2016-08-09 NOTE — Assessment & Plan Note (Signed)
Pelvic u/s to verify IUD in place.

## 2016-08-09 NOTE — Assessment & Plan Note (Signed)
Patient to contact Dr. Rutherford NailByrnett's office to schedule follow up

## 2016-08-09 NOTE — Progress Notes (Signed)
Gynecology Annual Exam  PCP: Patient, No Pcp Per  Chief Complaint  Patient presents with  . Annual Exam    History of Present Illness:  [name] is a 36 y.o. G3P1001 who LMP was Patient's last menstrual period was 07/10/2016., presents today for her annual examination.  Her menses are irregular with the IUD.    She is single partner, contraception - IUD.  Last Pap: 2 years ago  Results were: no abnormalities /neg HPV DNA negative Hx of STDs: none  Last mammogram: n/a There is no FH of breast cancer. There is no FH of ovarian cancer. The patient does not do self-breast exams.  Tobacco use: The patient denies current or previous tobacco use. Alcohol use: social drinker Exercise: moderately active   Has LLQ pain, as before.  No outpouching of prior area of inguinal hernia.  Discomfort associated with exertion most of the time.  Never severe pain.    The patient wears seatbelts: yes.      Has stopped taking Lexapro as of the past week.  Would like to restart at a higher dose (10 mg). Mainly her issue is that she has a "short fuse."    Review of Systems: Review of Systems  Constitutional: Negative.   HENT: Negative.   Eyes: Negative.   Respiratory: Negative.   Cardiovascular: Negative.   Gastrointestinal: Positive for abdominal pain (see HPI). Negative for blood in stool and melena.  Genitourinary: Negative.   Musculoskeletal: Negative.   Skin: Negative.   Neurological: Negative.   Psychiatric/Behavioral: Negative.     Past Medical History:  Diagnosis Date  . Medical history non-contributory     Past Surgical History:  Procedure Laterality Date  . CESAREAN SECTION  2014  . CESAREAN SECTION N/A 07/15/2015   Procedure: CESAREAN SECTION;  Surgeon: Conard Novak, MD;  Location: [facility] ORS;  Service: Obstetrics;  Laterality: N/A;    Medications:   Medication Sig Start Date End Date Taking? Authorizing Provider  escitalopram (LEXAPRO) 5 MG tablet Take 1 tablet  (5 mg total) by mouth daily. 07/03/16  Yes Conard Novak, MD  levonorgestrel (MIRENA) 20 MCG/24HR IUD 1 each by Intrauterine route once.   Yes [provider]    Allergies  Allergen Reactions  . Penicillin G Rash  . Penicillins Rash    Gynecologic History: Patient's last menstrual period was 07/10/2016.  Obstetric History: G3P3003, SVD x1, C-section x 2  Social History   Social History  . Marital status: Married    Spouse name: N/A  . Number of children: N/A  . Years of education: N/A   Occupational History  . Not on file.   Social History Main Topics  . Smoking status: Former Smoker    Packs/day: 1.00    Years: 5.00    Types: Cigarettes    Quit date: 07/13/2008  . Smokeless tobacco: Never Used  . Alcohol use No  . Drug use: No  . Sexual activity: Yes    Birth control/ protection: IUD   Other Topics Concern  . Not on file   Social History Narrative  . No narrative on file    Family History  Problem Relation Age of Onset  . Breast cancer Maternal Grandmother   . Colon cancer Maternal Grandmother      Physical Exam BP 118/70   Ht 5\' 6"  (1.676 m)   Wt 146 lb (66.2 kg)   LMP 07/10/2016   BMI 23.57 kg/m   Physical Exam  Constitutional: She is oriented to person, place, and time. She appears well-developed and well-nourished. No distress.  Genitourinary: Uterus normal. Pelvic exam was performed with patient supine. There is no rash, tenderness, lesion or injury on the right labia. There is no rash, tenderness, lesion or injury on the left labia. Vagina exhibits no lesion. No erythema, tenderness or bleeding in the vagina. No signs of injury around the vagina. Right adnexum does not display mass, does not display tenderness and does not display fullness. Left adnexum does not display mass, does not display tenderness and does not display fullness. Cervix does not exhibit motion tenderness, discharge or visible IUD strings (NO IUD STRING SEEN).    Uterus is mobile and retroverted. Uterus is not enlarged, tender or exhibiting a mass.  Eyes: EOM are normal. No scleral icterus.  Neck: Normal range of motion. Neck supple.  Cardiovascular: Normal rate and regular rhythm.   Pulmonary/Chest: Effort normal and breath sounds normal. No respiratory distress. She has no wheezes. She has no rales. Right breast exhibits no inverted nipple, no mass, no nipple discharge, no skin change and no tenderness. Left breast exhibits no inverted nipple, no mass, no nipple discharge, no skin change and no tenderness.  Abdominal: Soft. Bowel sounds are normal. She exhibits no distension and no mass. There is no tenderness. There is no rebound and no guarding. No hernia (unable to palpate defect along inguinal canal during valsalva).  Musculoskeletal: Normal range of motion. She exhibits no edema.  Neurological: She is alert and oriented to person, place, and time. No cranial nerve deficit.  Skin: Skin is warm and dry. No erythema.  Psychiatric: She has a normal mood and affect. Her behavior is normal. Judgment normal.   Female chaperone present for pelvic and breast  portions of the physical exam  Results: AUDIT Questionnaire (screen for alcoholism): 5 PHQ-9: 2   Assessment: 36 y.o. G3P1001 here for annual gynecologic examination.   IUD threads lost Pelvic u/s to verify IUD in place.   Premenstrual dysphoric disorder Increase dose of Lexapro to 10 mg.   Hernia of abdominal cavity Patient to contact Dr. Rutherford NailByrnett's office to schedule follow up   Plan: Problem List Items Addressed This Visit    Hernia of abdominal cavity    Patient to contact Dr. Rutherford NailByrnett's office to schedule follow up      Premenstrual dysphoric disorder - Primary    Increase dose of Lexapro to 10 mg.       Relevant Medications   escitalopram (LEXAPRO) 10 MG tablet   IUD threads lost    Pelvic u/s to verify IUD in place.       Relevant Orders   US Transvaginal Non-OB     Other Visit Diagnoses    Women's annual routine gynecological examination       Screening for alcohol problem         Screening: -- Blood pressure screen normal. -- Colonoscopy - n/a -- Mammogram - not due.  -- Weight screening: normal -- Nutrition: normal -- cholesterol screening: n/a -- osteoporosis screening: n/a -- tobacco screening: not using -- alcohol screening: AUDIT questionnaire indicates low-risk usage. -- family history of breast cancer screening: done. not at high risk. -- no evidence of domestic violence or intimate partner violence. -- STD screening: gonorrhea/chlamydia NAAT not collected. -- pap smear not collected.  Return in about 1 week (around 08/16/2016) for schedule pelvic u/s for IUD location.   Thomasene MohairStephen Shay Jhaveri, MD 08/09/2016 5:42 PM

## 2016-08-09 NOTE — Assessment & Plan Note (Signed)
Increase dose of Lexapro to 10 mg.

## 2016-08-22 ENCOUNTER — Other Ambulatory Visit: Payer: Self-pay | Admitting: Obstetrics and Gynecology

## 2016-08-22 ENCOUNTER — Ambulatory Visit (INDEPENDENT_AMBULATORY_CARE_PROVIDER_SITE_OTHER): Payer: BC Managed Care – PPO | Admitting: Obstetrics and Gynecology

## 2016-08-22 ENCOUNTER — Ambulatory Visit (INDEPENDENT_AMBULATORY_CARE_PROVIDER_SITE_OTHER): Payer: BC Managed Care – PPO

## 2016-08-22 ENCOUNTER — Encounter: Payer: Self-pay | Admitting: Obstetrics and Gynecology

## 2016-08-22 VITALS — BP 100/60 | HR 54 | Ht 64.0 in | Wt 138.0 lb

## 2016-08-22 DIAGNOSIS — T8332XA Displacement of intrauterine contraceptive device, initial encounter: Secondary | ICD-10-CM

## 2016-08-22 DIAGNOSIS — T8332XD Displacement of intrauterine contraceptive device, subsequent encounter: Secondary | ICD-10-CM

## 2016-08-22 NOTE — Progress Notes (Signed)
   Gynecology Ultrasound Follow Up   Chief Complaint  Patient presents with  . Follow-up    History of Present Illness: Patient is a 36 y.o. female who presents today for ultrasound evaluation of her IUD, the strings of which were not seen at her most recent appointment. Marland Kitchen.  Ultrasound demonstrates the following findgins Adnexa: no masses seen  Uterus: anteverted with endometrial stripe  3.9 mm Additional: IUD seen in correct location  Review of Systems: ROS - no issues  Past Medical History:  Diagnosis Date  . Medical history non-contributory     Past Surgical History:  Procedure Laterality Date  . CESAREAN SECTION  2014  . CESAREAN SECTION N/A 07/15/2015   Procedure: CESAREAN SECTION;  Surgeon: Conard NovakStephen D Lakeita Panther, MD;  Location: [facility] ORS;  Service: Obstetrics;  Laterality: N/A;    Physical Exam Vitals: Blood pressure 100/60, pulse (!) 54, height 5\' 4"  (1.626 m), weight 138 lb (62.6 kg), unknown if currently breastfeeding.  General: NAD HEENT: normocephalic, anicteric Pulmonary: No increased work of breathing Extremities: no edema, erythema, or tenderness Neurologic: Grossly intact, normal gait Psychiatric: mood appropriate, affect full   Assessment: 36 y.o. [id number] with IUD seen in correct location  Plan: Routine follow up. Reassurance provided.   Thomasene MohairStephen Yacoub Diltz, MD 08/22/2016 5:35 PM

## 2017-01-04 ENCOUNTER — Other Ambulatory Visit: Payer: Self-pay | Admitting: Obstetrics and Gynecology

## 2017-01-04 DIAGNOSIS — F3281 Premenstrual dysphoric disorder: Secondary | ICD-10-CM

## 2017-01-05 ENCOUNTER — Other Ambulatory Visit: Payer: Self-pay | Admitting: Obstetrics and Gynecology

## 2017-01-05 DIAGNOSIS — F3281 Premenstrual dysphoric disorder: Secondary | ICD-10-CM

## 2017-01-05 MED ORDER — ESCITALOPRAM OXALATE 10 MG PO TABS: 10.0000 mg | ORAL_TABLET | Freq: Every day | ORAL | 0 refills | Status: DC

## 2017-01-19 ENCOUNTER — Ambulatory Visit: Payer: BC Managed Care – PPO | Admitting: Obstetrics and Gynecology

## 2017-02-13 ENCOUNTER — Ambulatory Visit (INDEPENDENT_AMBULATORY_CARE_PROVIDER_SITE_OTHER): Payer: BC Managed Care – PPO | Admitting: Obstetrics and Gynecology

## 2017-02-13 ENCOUNTER — Encounter: Payer: Self-pay | Admitting: Obstetrics and Gynecology

## 2017-02-13 VITALS — BP 124/70 | Ht 66.0 in | Wt 140.0 lb

## 2017-02-13 DIAGNOSIS — R1032 Left lower quadrant pain: Secondary | ICD-10-CM | POA: Diagnosis not present

## 2017-02-13 DIAGNOSIS — G44209 Tension-type headache, unspecified, not intractable: Secondary | ICD-10-CM

## 2017-02-13 DIAGNOSIS — F3281 Premenstrual dysphoric disorder: Secondary | ICD-10-CM | POA: Diagnosis not present

## 2017-02-13 MED ORDER — ESCITALOPRAM OXALATE 10 MG PO TABS: 10.0000 mg | ORAL_TABLET | Freq: Every day | ORAL | 3 refills | Status: DC

## 2017-02-13 NOTE — Progress Notes (Addendum)
Obstetrics & Gynecology Office Visit   Chief Complaint  Patient presents with  . Lower abdominal pain   History of Present Illness: 36 y.o. G80P3003 female who presents for a refill of her lexapro medication and to discuss left lower quadrant abdominal pain. The pain has been present for about three months.  It occurs about once per month and lasts for several days.  She associates very bad back pain with this and she notes that she will have an episode or several episodes of larger-volume bowel movements and after this/these episodes she will feel completely better.  She denies nausea, vomiting, hematochezia, melena, inciting events. The incidences are not associated with any foods, eating or not eating.  She states that she has a regular bowel movement of soft consistency on a daily basis.  She indicates that she does not have to valsalva to any great extent to pass stool.  Nothing seems to make the pain worse. A heating pad does help alleviate her pain until it passes.  She has never had this issue before.  In her prior pregnancy, she had a left lower quadrant inguinal hernia. However, she has noted no issues with this since her delivery and has no pain in the same area.  She does have a history of left lower abdominal pain off and on for which no diagnosis has been found.  She continues to do well on Lexapro for PMDD on lexapro 10 mg.  She has no side effects and would like to continue this medication.    She also has been having headaches that seem to start at her neck and radiate forward.  She notes them mostly on days when she is at school. She does not notice these on days when she is away from school. She states they occur Monday-Friday and never on weekends or days off.  She denies changes in her vision, sensory perception, strength, coordination, hearing. She denies tinnitus.    Past Medical History:  Diagnosis Date  . Abnormal Pap smear of cervix   . Medical history non-contributory     Past Surgical History:  Procedure Laterality Date  . CESAREAN SECTION  2014  . CESAREAN SECTION N/A 07/15/2015   Procedure: CESAREAN SECTION;  Surgeon: Conard Novak, MD;  Location: [facility] ORS;  Service: Obstetrics;  Laterality: N/A;   Gynecologic History: No LMP recorded. Patient is not currently having periods (Reason: IUD).  Obstetric History: [id number]  Family History  Problem Relation Age of Onset  . Breast cancer Maternal Grandmother   . Colon cancer Maternal Grandmother     Social History   Socioeconomic History  . Marital status: Married    Spouse name: Not on file  . Number of children: Not on file  . Years of education: Not on file  . Highest education level: Not on file  Social Needs  . Financial resource strain: Not on file  . Food insecurity - worry: Not on file  . Food insecurity - inability: Not on file  . Transportation needs - medical: Not on file  . Transportation needs - non-medical: Not on file  Occupational History  . Not on file  Tobacco Use  . Smoking status: Former Smoker    Packs/day: 1.00    Years: 5.00    Pack years: 5.00    Types: Cigarettes    Last attempt to quit: 07/13/2008    Years since quitting: 8.6  . Smokeless tobacco: Never Used  Substance and Sexual Activity  .  Alcohol use: No    Alcohol/week: 0.0 oz  . Drug use: No  . Sexual activity: Yes    Birth control/protection: IUD  Other Topics Concern  . Not on file  Social History Narrative  . Not on file    Allergies  Allergen Reactions  . Penicillin G Rash  . Penicillins Rash    Prior to Admission medications   Medication Sig Start Date End Date Taking? Authorizing Provider  levonorgestrel (MIRENA) 20 MCG/24HR IUD 1 each by Intrauterine route once.   Yes [provider]  escitalopram (LEXAPRO) 10 MG tablet Take 1 tablet (10 mg total) by mouth daily. 01/05/17 02/04/17  Conard NovakJackson, Sonnie Bias D, MD  Prenatal Multivit-Min-Fe-FA (PRE-NATAL PO) Take by mouth.    [provider]    Review of Systems  Constitutional: Negative.   HENT: Negative.   Eyes: Negative.   Respiratory: Negative.   Cardiovascular: Negative.   Gastrointestinal: Positive for abdominal pain. Negative for blood in stool, constipation, diarrhea, heartburn, melena, nausea and vomiting.  Genitourinary: Negative.   Musculoskeletal: Negative.   Skin: Negative.   Neurological: Negative.   Psychiatric/Behavioral: Negative.      Physical Exam BP 124/70   Ht 5\' 6"  (1.676 m)   Wt 140 lb (63.5 kg)   BMI 22.60 kg/m  No LMP recorded. Patient is not currently having periods (Reason: IUD). Physical Exam  Constitutional: She is oriented to person, place, and time. She appears well-developed and well-nourished. No distress.  HENT:  Head: Normocephalic and atraumatic.  Eyes: EOM are normal. Pupils are equal, round, and reactive to light. No scleral icterus.  Neck: Normal range of motion.  Cardiovascular: Normal rate and regular rhythm.  Pulmonary/Chest: Effort normal and breath sounds normal. No respiratory distress.  Abdominal: Soft. Bowel sounds are normal. She exhibits no distension and no mass. There is no tenderness. There is no rebound and no guarding. No hernia.  Musculoskeletal: Normal range of motion. She exhibits no edema or deformity.  Neurological: She is alert and oriented to person, place, and time. No cranial nerve deficit.  Skin: Skin is warm and dry. No rash noted.  Psychiatric: She has a normal mood and affect. Her behavior is normal. Judgment normal.   Assessment: 36 y.o. 613P1001 female here for  1. Premenstrual dysphoric disorder   2. Intermittent left lower quadrant abdominal pain   3. Tension headache      Plan: Problem List Items Addressed This Visit    Premenstrual dysphoric disorder - Primary   Relevant Medications   escitalopram (LEXAPRO) 10 MG tablet   Intermittent left lower quadrant abdominal pain   Relevant Medications   escitalopram (LEXAPRO) 10  MG tablet   Butalbital-APAP-Caffeine 50-325-40 MG capsule   Other Relevant Orders   Ambulatory referral to Gastroenterology    Other Visit Diagnoses    Tension headache       Relevant Medications   escitalopram (LEXAPRO) 10 MG tablet   Butalbital-APAP-Caffeine 50-325-40 MG capsule    For abdominal pain: will refer to gastroenterology. She has had this pain for a long time, off and on.  She has had some imaging.  However, this may be more functional.  Given that her symptoms are relieved by defecation, I would consider a GI workup with a normal exam.  Referral made.  PMDD: doing well. Continue current therapy.    For tension headache: if new symptoms develop or other concerning signs, will consider further workup for new headache.   Thomasene MohairStephen Austina Constantin, MD 02/21/2017  1:39 PM

## 2017-02-19 ENCOUNTER — Encounter: Payer: Self-pay | Admitting: Obstetrics and Gynecology

## 2017-02-21 MED ORDER — BUTALBITAL-APAP-CAFFEINE 50-325-40 MG PO CAPS: 1.0000 | ORAL_CAPSULE | ORAL | 3 refills | Status: DC | PRN

## 2017-02-21 NOTE — Addendum Note (Signed)
Addended by: Thomasene MohairJACKSON, Amity Roes D on: 02/21/2017 01:40 PM   Modules accepted: Orders

## 2017-02-28 ENCOUNTER — Encounter: Payer: Self-pay | Admitting: Gastroenterology

## 2017-03-29 ENCOUNTER — Encounter: Payer: Self-pay | Admitting: Obstetrics and Gynecology

## 2017-04-04 ENCOUNTER — Encounter: Payer: Self-pay | Admitting: General Surgery

## 2017-04-04 ENCOUNTER — Ambulatory Visit: Payer: BC Managed Care – PPO | Admitting: General Surgery

## 2017-04-04 VITALS — BP 102/58 | HR 60 | Resp 11 | Ht 64.0 in | Wt 138.0 lb

## 2017-04-04 DIAGNOSIS — K4090 Unilateral inguinal hernia, without obstruction or gangrene, not specified as recurrent: Secondary | ICD-10-CM | POA: Diagnosis not present

## 2017-04-04 NOTE — Patient Instructions (Addendum)
The patient is aware to call back for any questions or new concerns.  Inguinal Hernia, Adult An inguinal hernia is when fat or the intestines push through the area where the leg meets the lower belly (groin) and make a rounded lump (bulge). This condition happens over time. There are three types of inguinal hernias. These types include:  Hernias that can be pushed back into the belly (are reducible).  Hernias that cannot be pushed back into the belly (are incarcerated).  Hernias that cannot be pushed back into the belly and lose their blood supply (get strangulated). This type needs emergency surgery.  Follow these instructions at home: Lifestyle  Drink enough fluid to keep your urine (pee) clear or pale yellow.  Eat plenty of fruits, vegetables, and whole grains. These have a lot of fiber. Talk with your doctor if you have questions.  Avoid lifting heavy objects.  Avoid standing for long periods of time.  Do not use tobacco products. These include cigarettes, chewing tobacco, or e-cigarettes. If you need help quitting, ask your doctor.  Try to stay at a healthy weight. General instructions  Do not try to force the hernia back in.  Watch your hernia for any changes in color or size. Let your doctor know if there are any changes.  Take over-the-counter and prescription medicines only as told by your doctor.  Keep all follow-up visits as told by your doctor. This is important. Contact a doctor if:  You have a fever.  You have new symptoms.  Your symptoms get worse. Get help right away if:  The area where the legs meets the lower belly has: ? Pain that gets worse suddenly. ? A bulge that gets bigger suddenly and does not go down. ? A bulge that turns red or purple. ? A bulge that is painful to the touch.  You are a man and your scrotum: ? Suddenly feels painful. ? Suddenly changes in size.  You feel sick to your stomach (nauseous) and this feeling does not go  away.  You throw up (vomit) and this keeps happening.  You feel your heart beating a lot more quickly than normal.  You cannot poop (have a bowel movement) or pass gas. This information is not intended to replace advice given to you by your health care provider. Make sure you discuss any questions you have with your health care provider. Document Released: 04/06/2006 Document Revised: 08/12/2015 Document Reviewed: 01/14/2014 Elsevier Interactive Patient Education  2018 Elsevier Inc.  

## 2017-04-04 NOTE — Progress Notes (Signed)
Patient ID: Christie Cannon, female   DOB: Aug 07, 1980, 37 y.o.   MRN: [id number]  Chief Complaint  Patient presents with  . Other    HPI Christie Cannon is a 37 y.o. female here today for a re evalaution of a left inguinal hernia. Patient states she is doing well. She wants to discuss surgery. She states the pain is more constant and is uncomfortable with sitting. She does have an appointment with GI regarding some abdominal pain/back pain. She does think that when she has several BM's it helps.  She states her bowels move daily without difficulty. The patient was last seen on April 14, 2015 at which time she was [redacted] weeks pregnant.  Clinical examination at that time clearly showed a left inguinal hernia.  Dr. Edison PaceJackson's operative report from the time of her cesarean section on April 2017 described a fascial defect in the left groin. She is her with Christie Cannon, her husband and oldest son.  HPI  Past Medical History:  Diagnosis Date  . Abnormal Pap smear of cervix   . Medical history non-contributory     Past Surgical History:  Procedure Laterality Date  . CESAREAN SECTION  2014  . CESAREAN SECTION N/A 07/15/2015   Procedure: CESAREAN SECTION;  Surgeon: Conard NovakStephen D Jackson, MD;  Location: [facility] ORS;  Service: Obstetrics;  Laterality: N/A;    Family History  Problem Relation Age of Onset  . Breast cancer Maternal Grandmother   . Colon cancer Maternal Grandmother     Social History Social History   Tobacco Use  . Smoking status: Former Smoker    Packs/day: 1.00    Years: 5.00    Pack years: 5.00    Types: Cigarettes    Last attempt to quit: 07/13/2008    Years since quitting: 8.7  . Smokeless tobacco: Never Used  Substance Use Topics  . Alcohol use: No    Alcohol/week: 0.0 oz  . Drug use: No    Allergies  Allergen Reactions  . Penicillin G Rash  . Penicillins Rash    Current Outpatient Medications  Medication Sig Dispense Refill  . Butalbital-APAP-Caffeine 50-325-40 MG  capsule Take 1 capsule by mouth every 4 (four) hours as needed for headache. 30 capsule 3  . escitalopram (LEXAPRO) 10 MG tablet Take 1 tablet (10 mg total) by mouth daily. 90 tablet 3  . levonorgestrel (MIRENA) 20 MCG/24HR IUD 1 each by Intrauterine route once.    . Prenatal Multivit-Min-Fe-FA (PRE-NATAL PO) Take by mouth.     No current facility-administered medications for this visit.     Review of Systems Review of Systems  Constitutional: Negative.   Respiratory: Negative.   Cardiovascular: Negative.   Neurological: Positive for headaches.    Blood pressure (!) 102/58, pulse 60, resp. rate 11, height 5\' 4"  (1.626 m), weight 138 lb (62.6 kg), SpO2 98 %, unknown if currently breastfeeding.  Physical Exam Physical Exam  Constitutional: She is oriented to person, place, and time. She appears well-developed and well-nourished.  HENT:  Mouth/Throat: Oropharynx is clear and moist.  Eyes: Conjunctivae are normal. No scleral icterus.  Neck: Neck supple.  Cardiovascular: Normal rate, regular rhythm and normal heart sounds.  Pulmonary/Chest: Effort normal and breath sounds normal. Right breast exhibits no inverted nipple, no mass, no nipple discharge, no skin change and no tenderness. Left breast exhibits no inverted nipple, no mass, no nipple discharge, no skin change and no tenderness.  Abdominal: Soft. Bowel sounds are normal. There is no hepatomegaly.  There is no tenderness. A hernia is present. Hernia confirmed positive in the left inguinal area.    Lymphadenopathy:    She has no cervical adenopathy.    She has no axillary adenopathy.  Neurological: She is alert and oriented to person, place, and time.  Skin: Skin is warm and dry.  Psychiatric: Her behavior is normal.    Data Reviewed CT scan of the abdomen and pelvis dated May 06, 2014 obtained for left upper quadrant pain prior to her third pregnancy showed no acute findings.  These images were reviewed with no evidence  of abdominal wall defect at that time.  Assessment    Left inguinal hernia based on 2017 exam.      Plan    The patient has an upcoming appointment with the GI department for evaluation of her long-standing left upper quadrant pain. After her evaluation is complete, she is going to contact the office to schedule elective hernia repair.  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  At this time, her clinical history would not strongly suggest the need for a colonoscopy, but would not plan to have this completed at the same time as her inguinal hernia repair.  The potential use of mesh based on her operative findings and also her final plans regarding future children.  At this time, with boys  7, soon to be 5 and soon to be 34-year-old additional children are not in the offing, but would likely avoid mesh if at the time of surgery she is thinking about additional pregnancies.     Hernia precautions and incarceration were discussed with the patient. If they develop symptoms of an incarcerated hernia, they were encouraged to seek prompt medical attention.  I have recommended repair of the hernia using mesh on an outpatient basis in the near future. The risk of infection was reviewed. The role of prosthetic mesh to minimize the risk of recurrence was reviewed.  HPI, Physical Exam, Assessment and Plan have been scribed under the direction and in the presence of Earline Mayotte, MD. Christie Daft, RN   Christie Cannon 04/04/2017, 6:48 PM

## 2017-04-12 ENCOUNTER — Other Ambulatory Visit: Payer: Self-pay

## 2017-04-12 ENCOUNTER — Encounter: Payer: Self-pay | Admitting: Gastroenterology

## 2017-04-12 ENCOUNTER — Ambulatory Visit: Payer: BC Managed Care – PPO | Admitting: Gastroenterology

## 2017-04-12 VITALS — BP 104/68 | HR 81 | Ht 66.0 in | Wt 138.0 lb

## 2017-04-12 DIAGNOSIS — R1013 Epigastric pain: Secondary | ICD-10-CM | POA: Diagnosis not present

## 2017-04-12 DIAGNOSIS — K5900 Constipation, unspecified: Secondary | ICD-10-CM | POA: Diagnosis not present

## 2017-04-12 DIAGNOSIS — R1012 Left upper quadrant pain: Secondary | ICD-10-CM

## 2017-04-12 NOTE — Progress Notes (Signed)
Christie Cannon [address]  [address]  [place], [place] [id number]  Main: [phone]  Fax: ([phone] [phone]) [phone]   Gastroenterology Consultation  Referring Provider:     Conard Cannon, Christie D, MD Primary Care Physician:  Patient, No Pcp Per Primary Gastroenterologist:  Dr. Melodie BouillonVarnita Braycen Cannon Reason for Consultation:     Abdominal pain        HPI:   Christie Cannon is a 37 y.o. y/o female referred for consultation & management  by Dr. Patient, No Pcp Per.  Patient reports chronic history of left-sided abdominal pain, points to her left mid abdomen, cramping, 2/10, with radiation to the back, occurs every few days, not associated with any nausea also reports mid epigastric abdominal pain.  Denies any dysphagia or heartburn.  Denies any weight loss.  States that she has noticed when she has smaller bowel movements the pain is worse, and when she has large abdominal pain is better.  Pain is better after bowel movement as well.  No blood in her stool.  No previous history of endoscopies.  No immediate family history of colon cancer.  She has a left-sided inguinal hernia and plans on going for elective surgery for this.  She had a CT abdomen in February 2016 for this same pain, in no acute findings were seen.  The scan report is under imaging.  Last lab work from April 2017 shows a normal hemoglobin and normal MCV.  Patient reports sparing use of NSAIDs.  States takes ibuprofen once every 2-3 months.  Past Medical History:  Diagnosis Date  . Abnormal Pap smear of cervix   . Medical history non-contributory     Past Surgical History:  Procedure Laterality Date  . CESAREAN SECTION  2014  . CESAREAN SECTION N/A 07/15/2015   Procedure: CESAREAN SECTION;  Surgeon: Christie NovakStephen Cannon Jackson, MD;  Location: [facility] OR[facility];  Service: Obstetrics;  Laterality: N/A;    Prior to Admission medications   Medication Sig Start Date End Date Taking? Authorizing Provider  Butalbital-APAP-Caffeine 50-325-40 MG capsule  Take 1 capsule by mouth every 4 (four) hours as needed for headache. 02/21/17  Yes Christie Cannon, Christie D, MD  escitalopram (LEXAPRO) 10 MG tablet Take 1 tablet (10 mg total) by mouth daily. 02/13/17 05/14/17 Yes Christie Cannon, Christie D, MD  levonorgestrel (MIRENA) 20 MCG/24HR IUD 1 each by Intrauterine route once.   Yes [provider]  Prenatal Multivit-Min-Fe-FA (PRE-NATAL PO) Take by mouth.    [provider]    Family History  Problem Relation Age of Onset  . Breast cancer Maternal Grandmother   . Colon cancer Maternal Grandmother      Social History   Tobacco Use  . Smoking status: Former Smoker    Packs/day: 1.00    Years: 5.00    Pack years: 5.00    Types: Cigarettes    Last attempt to quit: 07/13/2008    Years since quitting: 8.7  . Smokeless tobacco: Never Used  Substance Use Topics  . Alcohol use: No    Alcohol/week: 0.0 oz  . Drug use: No    Allergies as of 04/12/2017 - Review Complete 04/12/2017  Allergen Reaction Noted  . Penicillin g Rash 11/12/2014  . Penicillins Rash 04/14/2015    Review of Systems:    All systems reviewed and negative except where noted in HPI.   Physical Exam:  BP 104/68   Pulse 81   Ht 5\' 6"  (1.676 m)   Wt 138 lb (62.6 kg)   BMI  22.27 kg/m  No LMP recorded. Patient is not currently having periods (Reason: IUD). Psych:  Alert and cooperative. Normal mood and affect. General:   Alert,  Well-developed, well-nourished, pleasant and cooperative in NAD Head:  Normocephalic and atraumatic. Eyes:  Sclera clear, no icterus.   Conjunctiva pink. Ears:  Normal auditory acuity. Nose:  No deformity, discharge, or lesions. Mouth:  No deformity or lesions,oropharynx pink & moist. Neck:  Supple; no masses or thyromegaly. Lungs:  Respirations even and unlabored.  Clear throughout to auscultation.   No wheezes, crackles, or rhonchi. No acute distress. Heart:  Regular rate and rhythm; no murmurs, clicks, rubs, or gallops. Abdomen:  Normal  bowel sounds.  No bruits.  Soft, non-tender and non-distended without masses (no tenderness noted when palpated with stethoscope bell, reports mild tenderness when palpated with hand and specifically asked patient about pain), hepatosplenomegaly or hernias noted.  No guarding or rebound tenderness.    Msk:  Symmetrical without gross deformities. Good, equal movement & strength bilaterally. Pulses:  Normal pulses noted. Extremities:  No clubbing or edema.  No cyanosis. Neurologic:  Alert and oriented x3;  grossly normal neurologically. Skin:  Intact without significant lesions or rashes. No jaundice. Lymph Nodes:  No significant cervical adenopathy. Psych:  Alert and cooperative. Normal mood and affect.   Labs: CBC    Component Value Date/Time   WBC 10.1 07/16/2015 0556   RBC 3.73 (L) 07/16/2015 0556   HGB 12.0 07/16/2015 0556   HGB 12.7 06/17/2012 1145   HCT 34.6 (L) 07/16/2015 0556   HCT 28.3 (L) 06/19/2012 0553   PLT 141 (L) 07/16/2015 0556   PLT 169 06/17/2012 1145   MCV 92.7 07/16/2015 0556   MCV 94 06/17/2012 1145   MCH 32.0 07/16/2015 0556   MCHC 34.6 07/16/2015 0556   RDW 13.6 07/16/2015 0556   RDW 13.0 06/17/2012 1145   LYMPHSABS 1.3 06/17/2012 1145   MONOABS 0.5 06/17/2012 1145   EOSABS 0.1 06/17/2012 1145   BASOSABS 0.0 06/17/2012 1145   CMP  No results found for: NA, K, CL, CO2, GLUCOSE, BUN, CREATININE, CALCIUM, PROT, ALBUMIN, AST, ALT, ALKPHOS, BILITOT, GFRNONAA, GFRAA  Imaging Studies: No results found.  Assessment and Plan:   Christie Cannon is a 38 y.o. y/o female has been referred for chronic abdominal pain with no alarm features  Pain improving after bowel movements, is likely consistent with constipation induced abdominal pain. Patient states when she is small bowel as her pain is worse, when she has a large bowel movement the pain is better.  this is also consistent with constipation induced pain. Start MiraLAX daily, with goal of 1-2 moderate to large  size bowel movements daily High-fiber diet  No heavy NSAID use, patient asked to take NSAIDs with food if she needs to take them, and was advised against taking more than 1-2 a month to prevent GI ulcers.  Due to dyspepsia we will order H. pylori testing as well.  Patient was chewing gum today which can interfere with the testing.  She will return for nurse visit for breath testing or get stool H. pylori testing done.  No alarm symptoms present to indicate endoscopy.  Patient develops altered bowel habits, blood in stool, dysphagia, nausea or vomiting, worsening abdominal pain, fever chills or any other reason for concern she was asked to call us.  She verbalized understanding.  Dr Melodie Bouillon

## 2017-04-13 ENCOUNTER — Other Ambulatory Visit: Payer: Self-pay

## 2017-04-13 DIAGNOSIS — R1013 Epigastric pain: Secondary | ICD-10-CM

## 2017-04-13 NOTE — Addendum Note (Signed)
Addended by: Jackquline DenmarkIDGEWAY, Jehad Bisono W on: 04/13/2017 04:49 PM   Modules accepted: Orders

## 2017-04-16 ENCOUNTER — Other Ambulatory Visit: Payer: Self-pay | Admitting: General Surgery

## 2017-04-16 DIAGNOSIS — K4090 Unilateral inguinal hernia, without obstruction or gangrene, not specified as recurrent: Secondary | ICD-10-CM

## 2017-04-17 ENCOUNTER — Encounter: Payer: Self-pay | Admitting: Gastroenterology

## 2017-04-19 ENCOUNTER — Encounter
Admission: RE | Admit: 2017-04-19 | Discharge: 2017-04-19 | Disposition: A | Discharge status: Home / Self Care | Payer: BC Managed Care – PPO | Source: Ambulatory Visit | Attending: General Surgery | Admitting: General Surgery

## 2017-04-19 ENCOUNTER — Other Ambulatory Visit: Payer: Self-pay

## 2017-04-19 HISTORY — DX: Cardiac arrhythmia, unspecified: I49.9

## 2017-04-19 HISTORY — DX: Headache: R51

## 2017-04-19 HISTORY — DX: Headache, unspecified: R51.9

## 2017-04-19 NOTE — Patient Instructions (Signed)
  Your procedure is scheduled on: 04-23-17 MONDAY Report to Same Day Surgery 2nd floor medical mall [facility](Medical Mall Entrance-take elevator on left to 2nd floor.  Check in with surgery information desk.) To find out your arrival time please call [phone] between 1PM - 3PM on 04-20-17 FRIDAY  Remember: Instructions that are not followed completely may result in serious medical risk, up to and including death, or upon the discretion of your surgeon and anesthesiologist your surgery may need to be rescheduled.    _x___ 1. Do not eat food after midnight the night before your procedure. NO GUM OR CANDY AFTER MIDNIGHT. You may drink clear liquids up to 2 hours before you are scheduled to arrive at the hospital for your procedure.  Do not drink clear liquids within 2 hours of your scheduled arrival to the hospital.  Clear liquids include  --Water or Apple juice without pulp  --Clear carbohydrate beverage such as ClearFast or Gatorade  --Black Coffe e or Clear Tea (No milk, no creamers, do not add anything to the coffee or Tea    __x__ 2. No Alcohol for 24 hours before or after surgery.   __x__3. No Smoking for 24 prior to surgery.   ____  4. Bring all medications with you on the day of surgery if instructed.    __x__ 5. Notify your doctor if there is any change in your medical condition     (cold, fever, infections).     Do not wear jewelry, make-up, hairpins, clips or nail polish.  Do not wear lotions, powders, or perfumes. You may wear deodorant.  Do not shave 48 hours prior to surgery. Men may shave face and neck.  Do not bring valuables to the hospital.    [facility] is not responsible for any belongings or valuables.               Contacts, dentures or bridgework may not be worn into surgery.  Leave your suitcase in the car. After surgery it may be brought to your room.  For patients admitted to the hospital, discharge time is determined by yourtreatment team.   Patients discharged the  day of surgery will not be allowed to drive home.  You will need someone to drive you home and stay with you the night of your procedure.      ____ Take anti-hypertensive listed below, cardiac, seizure, asthma, anti-reflux and psychiatric medicines. These include:  1. NONE  2.  3.  4.  5.  6.  ____Fleets enema or Magnesium Citrate as directed.   ____ Use CHG Soap or sage wipes as directed on instruction sheet   ____ Use inhalers on the day of surgery and bring to hospital day of surgery  ____ Stop Metformin and Janumet 2 days prior to surgery.    ____ Take 1/2 of usual insulin dose the night before surgery and none on the morning     surgery.   ____ Follow recommendations from Cardiologist, Pulmonologist or PCP regarding          stopping Aspirin, Coumadin, Plavix ,Eliquis, Effient, or Pradaxa, and Pletal.  ____Stop Anti-inflammatories such as Advil, Aleve, Ibuprofen, Motrin, Naproxen, Naprosyn, Goodies powders or aspirin products. OK to take Tylenol    ____ Stop supplements until after surgery  ____ Bring C-Pap to the hospital.

## 2017-04-22 MED ORDER — CEFAZOLIN SODIUM-DEXTROSE 2-4 GM/100ML-% IV SOLN
2.0000 g | INTRAVENOUS | Status: AC
  Administered 2017-04-23: 2 g via INTRAVENOUS

## 2017-04-23 ENCOUNTER — Encounter
Admission: RE | Disposition: A | Discharge status: Home / Self Care | Payer: Self-pay | Source: Ambulatory Visit | Attending: General Surgery

## 2017-04-23 ENCOUNTER — Ambulatory Visit
Admission: RE | Admit: 2017-04-23 | Discharge: 2017-04-23 | Disposition: A | Discharge status: Home / Self Care | Payer: BC Managed Care – PPO | Source: Ambulatory Visit | Attending: General Surgery | Admitting: General Surgery

## 2017-04-23 ENCOUNTER — Encounter: Payer: Self-pay | Admitting: *Deleted

## 2017-04-23 ENCOUNTER — Other Ambulatory Visit: Payer: Self-pay

## 2017-04-23 ENCOUNTER — Other Ambulatory Visit: Payer: Self-pay | Admitting: Gastroenterology

## 2017-04-23 ENCOUNTER — Ambulatory Visit: Payer: BC Managed Care – PPO | Admitting: Anesthesiology

## 2017-04-23 DIAGNOSIS — K4090 Unilateral inguinal hernia, without obstruction or gangrene, not specified as recurrent: Secondary | ICD-10-CM | POA: Diagnosis not present

## 2017-04-23 DIAGNOSIS — Z79899 Other long term (current) drug therapy: Secondary | ICD-10-CM | POA: Diagnosis not present

## 2017-04-23 DIAGNOSIS — Z87891 Personal history of nicotine dependence: Secondary | ICD-10-CM | POA: Diagnosis not present

## 2017-04-23 DIAGNOSIS — F329 Major depressive disorder, single episode, unspecified: Secondary | ICD-10-CM | POA: Insufficient documentation

## 2017-04-23 DIAGNOSIS — Z880 Allergy status to penicillin: Secondary | ICD-10-CM | POA: Diagnosis not present

## 2017-04-23 HISTORY — PX: INGUINAL HERNIA REPAIR: SHX194

## 2017-04-23 LAB — POCT PREGNANCY, URINE: PREG TEST UR: NEGATIVE

## 2017-04-23 SURGERY — REPAIR, HERNIA, INGUINAL, ADULT
Anesthesia: General | Laterality: Left

## 2017-04-23 MED ORDER — ONDANSETRON HCL 4 MG/2ML IJ SOLN
INTRAMUSCULAR | Status: AC
  Filled 2017-04-23: qty 2

## 2017-04-23 MED ORDER — KETOROLAC TROMETHAMINE 30 MG/ML IJ SOLN
INTRAMUSCULAR | Status: AC
  Filled 2017-04-23: qty 1

## 2017-04-23 MED ORDER — ACETAMINOPHEN 10 MG/ML IV SOLN
INTRAVENOUS | Status: DC | PRN
  Administered 2017-04-23: 1000 mg via INTRAVENOUS

## 2017-04-23 MED ORDER — HYDROCODONE-ACETAMINOPHEN 5-325 MG PO TABS: 1.0000 | ORAL_TABLET | ORAL | 0 refills | Status: DC | PRN

## 2017-04-23 MED ORDER — LACTATED RINGERS IV SOLN
INTRAVENOUS | Status: DC
  Administered 2017-04-23: 07:00:00 via INTRAVENOUS

## 2017-04-23 MED ORDER — PHENYLEPHRINE HCL 10 MG/ML IJ SOLN
INTRAMUSCULAR | Status: AC
  Filled 2017-04-23: qty 1

## 2017-04-23 MED ORDER — MIDAZOLAM HCL 2 MG/2ML IJ SOLN
INTRAMUSCULAR | Status: AC
  Filled 2017-04-23: qty 2

## 2017-04-23 MED ORDER — ONDANSETRON HCL 4 MG/2ML IJ SOLN: 4.0000 mg | Freq: Once | INTRAMUSCULAR | Status: DC | PRN

## 2017-04-23 MED ORDER — FAMOTIDINE 20 MG PO TABS
20.0000 mg | ORAL_TABLET | Freq: Once | ORAL | Status: AC
  Administered 2017-04-23: 20 mg via ORAL

## 2017-04-23 MED ORDER — PROPOFOL 10 MG/ML IV BOLUS
INTRAVENOUS | Status: DC | PRN
  Administered 2017-04-23: 130 mg via INTRAVENOUS

## 2017-04-23 MED ORDER — BUPIVACAINE-EPINEPHRINE (PF) 0.5% -1:200000 IJ SOLN
INTRAMUSCULAR | Status: AC
  Filled 2017-04-23: qty 30

## 2017-04-23 MED ORDER — FENTANYL CITRATE (PF) 100 MCG/2ML IJ SOLN
INTRAMUSCULAR | Status: AC
  Filled 2017-04-23: qty 2

## 2017-04-23 MED ORDER — FENTANYL CITRATE (PF) 100 MCG/2ML IJ SOLN: 25.0000 ug | INTRAMUSCULAR | Status: DC | PRN

## 2017-04-23 MED ORDER — ACETAMINOPHEN 10 MG/ML IV SOLN
INTRAVENOUS | Status: AC
  Filled 2017-04-23: qty 100

## 2017-04-23 MED ORDER — DEXAMETHASONE SODIUM PHOSPHATE 10 MG/ML IJ SOLN
INTRAMUSCULAR | Status: AC
  Filled 2017-04-23: qty 1

## 2017-04-23 MED ORDER — DEXAMETHASONE SODIUM PHOSPHATE 10 MG/ML IJ SOLN
INTRAMUSCULAR | Status: DC | PRN
  Administered 2017-04-23: 4 mg via INTRAVENOUS

## 2017-04-23 MED ORDER — PHENYLEPHRINE HCL 10 MG/ML IJ SOLN
INTRAMUSCULAR | Status: DC | PRN
  Administered 2017-04-23 (×2): 50 ug via INTRAVENOUS

## 2017-04-23 MED ORDER — FAMOTIDINE 20 MG PO TABS
ORAL_TABLET | ORAL | Status: AC
  Administered 2017-04-23: 20 mg via ORAL
  Filled 2017-04-23: qty 1

## 2017-04-23 MED ORDER — CEFAZOLIN SODIUM-DEXTROSE 2-4 GM/100ML-% IV SOLN
INTRAVENOUS | Status: AC
  Filled 2017-04-23: qty 100

## 2017-04-23 MED ORDER — FENTANYL CITRATE (PF) 100 MCG/2ML IJ SOLN
INTRAMUSCULAR | Status: DC | PRN
  Administered 2017-04-23 (×2): 25 ug via INTRAVENOUS
  Administered 2017-04-23: 50 ug via INTRAVENOUS

## 2017-04-23 MED ORDER — MIDAZOLAM HCL 2 MG/2ML IJ SOLN
INTRAMUSCULAR | Status: DC | PRN
  Administered 2017-04-23: 2 mg via INTRAVENOUS

## 2017-04-23 MED ORDER — BUPIVACAINE-EPINEPHRINE (PF) 0.5% -1:200000 IJ SOLN
INTRAMUSCULAR | Status: DC | PRN
  Administered 2017-04-23: 10 mL
  Administered 2017-04-23: 20 mL

## 2017-04-23 MED ORDER — LIDOCAINE HCL (CARDIAC) 20 MG/ML IV SOLN
INTRAVENOUS | Status: DC | PRN
  Administered 2017-04-23: 50 mg via INTRAVENOUS

## 2017-04-23 MED ORDER — LIDOCAINE HCL (PF) 2 % IJ SOLN
INTRAMUSCULAR | Status: AC
  Filled 2017-04-23: qty 10

## 2017-04-23 MED ORDER — ONDANSETRON HCL 4 MG/2ML IJ SOLN
INTRAMUSCULAR | Status: DC | PRN
  Administered 2017-04-23: 4 mg via INTRAVENOUS

## 2017-04-23 MED ORDER — PROPOFOL 10 MG/ML IV BOLUS
INTRAVENOUS | Status: AC
  Filled 2017-04-23: qty 20

## 2017-04-23 SURGICAL SUPPLY — 32 items
BLADE SURG 15 STRL SS SAFETY (BLADE) ×6 IMPLANT
CANISTER SUCT 1200ML W/VALVE (MISCELLANEOUS) ×3 IMPLANT
CHLORAPREP W/TINT 26ML (MISCELLANEOUS) ×3 IMPLANT
CLOSURE WOUND 1/2 X4 (GAUZE/BANDAGES/DRESSINGS) ×1
DECANTER SPIKE VIAL GLASS SM (MISCELLANEOUS) ×3 IMPLANT
DRAIN PENROSE 1/4X12 LTX (DRAIN) IMPLANT
DRAPE LAPAROTOMY 100X77 ABD (DRAPES) ×3 IMPLANT
DRSG TEGADERM 4X4.75 (GAUZE/BANDAGES/DRESSINGS) ×3 IMPLANT
DRSG TELFA 4X3 1S NADH ST (GAUZE/BANDAGES/DRESSINGS) ×3 IMPLANT
ELECT REM PT RETURN 9FT ADLT (ELECTROSURGICAL) ×3
ELECTRODE REM PT RTRN 9FT ADLT (ELECTROSURGICAL) ×1 IMPLANT
GLOVE BIO SURGEON STRL SZ7.5 (GLOVE) ×3 IMPLANT
GLOVE INDICATOR 8.0 STRL GRN (GLOVE) ×3 IMPLANT
GOWN STRL REUS W/ TWL LRG LVL3 (GOWN DISPOSABLE) ×2 IMPLANT
GOWN STRL REUS W/TWL LRG LVL3 (GOWN DISPOSABLE) ×4
KIT TURNOVER KIT A (KITS) ×3 IMPLANT
LABEL OR SOLS (LABEL) ×3 IMPLANT
NEEDLE HYPO 22GX1.5 SAFETY (NEEDLE) ×6 IMPLANT
NEEDLE HYPO 25X1 1.5 SAFETY (NEEDLE) ×3 IMPLANT
PACK BASIN MINOR ARMC (MISCELLANEOUS) ×3 IMPLANT
STRIP CLOSURE SKIN 1/2X4 (GAUZE/BANDAGES/DRESSINGS) ×2 IMPLANT
SUT SURGILON 0 BLK (SUTURE) ×6 IMPLANT
SUT VIC AB 2-0 SH 27 (SUTURE) ×2
SUT VIC AB 2-0 SH 27XBRD (SUTURE) ×1 IMPLANT
SUT VIC AB 3-0 54X BRD REEL (SUTURE) ×1 IMPLANT
SUT VIC AB 3-0 BRD 54 (SUTURE) ×2
SUT VIC AB 3-0 SH 27 (SUTURE) ×2
SUT VIC AB 3-0 SH 27X BRD (SUTURE) ×1 IMPLANT
SUT VIC AB 4-0 FS2 27 (SUTURE) ×3 IMPLANT
SWABSTK COMLB BENZOIN TINCTURE (MISCELLANEOUS) ×3 IMPLANT
SYR 3ML LL SCALE MARK (SYRINGE) ×3 IMPLANT
SYR CONTROL 10ML (SYRINGE) ×6 IMPLANT

## 2017-04-23 NOTE — H&P (Signed)
No change in clinical history or exam. For left inguinal hernia repair.  

## 2017-04-23 NOTE — Anesthesia Post-op Follow-up Note (Signed)
Anesthesia QCDR form completed.        

## 2017-04-23 NOTE — Anesthesia Preprocedure Evaluation (Signed)
Anesthesia Evaluation  Patient identified by MRN, date of birth, ID band Patient awake    Reviewed: Allergy & Precautions, NPO status , Patient's Chart, lab work & pertinent test results  History of Anesthesia Complications Negative for: history of anesthetic complications  Airway Mallampati: II       Dental   Pulmonary neg sleep apnea, neg COPD, former smoker,           Cardiovascular (-) hypertension(-) Past MI and (-) CHF + dysrhythmias (occassional tachycardia with caffiene) (-) Valvular Problems/Murmurs     Neuro/Psych neg Seizures Depression    GI/Hepatic Neg liver ROS, neg GERD  ,  Endo/Other  neg diabetes  Renal/GU negative Renal ROS     Musculoskeletal   Abdominal   Peds  Hematology   Anesthesia Other Findings   Reproductive/Obstetrics                             Anesthesia Physical Anesthesia Plan  ASA: II  Anesthesia Plan: General   Post-op Pain Management:    Induction: Intravenous  PONV Risk Score and Plan: 3 and Dexamethasone, Ondansetron and Midazolam  Airway Management Planned: LMA  Additional Equipment:   Intra-op Plan:   Post-operative Plan:   Informed Consent: I have reviewed the patients History and Physical, chart, labs and discussed the procedure including the risks, benefits and alternatives for the proposed anesthesia with the patient or authorized representative who has indicated his/her understanding and acceptance.     Plan Discussed with:   Anesthesia Plan Comments:         Anesthesia Quick Evaluation

## 2017-04-23 NOTE — Transfer of Care (Signed)
Immediate Anesthesia Transfer of Care Note  Patient: Christie Cannon  Procedure(s) Performed: HERNIA REPAIR INGUINAL ADULT (Left )  Patient Location: PACU  Anesthesia Type:General  Level of Consciousness: sedated  Airway & Oxygen Therapy: Patient Spontanous Breathing and Patient connected to face mask oxygen  Post-op Assessment: Report given to RN and Post -op Vital signs reviewed and stable  Post vital signs: Reviewed and stable  Last Vitals:  Vitals:   04/23/17 0623 04/23/17 0818  BP: 96/63 (!) 103/58  Pulse: 65 66  Resp: 18 13  Temp: (!) 35.9 C   SpO2: 100% 100%    Last Pain:  Vitals:   04/23/17 0623  TempSrc: Tympanic  PainSc: 1          Complications: No apparent anesthesia complications

## 2017-04-23 NOTE — Anesthesia Postprocedure Evaluation (Signed)
Anesthesia Post Note  Patient: Christie Cannon  Procedure(s) Performed: HERNIA REPAIR INGUINAL ADULT (Left )  Patient location during evaluation: PACU Anesthesia Type: General Level of consciousness: awake and alert Pain management: pain level controlled Vital Signs Assessment: post-procedure vital signs reviewed and stable Respiratory status: spontaneous breathing and respiratory function stable Cardiovascular status: stable Anesthetic complications: no     Last Vitals:  Vitals:   04/23/17 0833 04/23/17 0848  BP: (!) 103/57 (!) 98/59  Pulse: 77 66  Resp: 18 10  Temp:  36.8 C  SpO2: 100% 100%    Last Pain:  Vitals:   04/23/17 0848  TempSrc:   PainSc: 2                  KEPHART,WILLIAM K

## 2017-04-23 NOTE — Op Note (Signed)
Preoperative diagnosis: Left inguinal hernia.  Postoperative diagnosis: Same.  Operative procedure: Left inguinal herniorrhaphy.  Operating Surgeon Donnalee CurryJeffrey Nikolay Demetriou, MD.  Anesthesia: General by LMA, Marcaine 0.5% with 1-200,000 units of epinephrine, 30 cc, Toradol 30 mg.  Estimated blood loss: Less than 5 cc.  Clinical note: This 37 year old woman is developed a left inguinal hernia.  She is admitted for elective repair.  She received Kefzol prior to the procedure.  We discussed the possibility of prosthetic mesh in light of possible future pregnancies.  Operative note: With the patient under adequate general anesthesia the area was cleansed with ChloraPrep and draped.  It was elected to make a small 4 cm incision inferior and lateral to her previous C-section incision.  Field block anesthesia was established prior to skin incision.  Skin was incised sharply.  Hemostasis was electrocautery and 3-0 Vicryl ties.  The external oblique was opened in the direction of its fibers.  The iliohypogastric nerve was identified in the lower aspect of the field overlying the round ligament.  This was mobilized superiorly.  The hernia sac was identified.  No contents.  This was twisted upon itself and transfixed with a 3-0 Vicryl suture ligature followed by 3-0 Vicryl tie divided.  The internal ring was obliterated with a single 0 Surgilon figure-of-eight suture between the transverse abdominis aponeurosis and the iliopubic tract.  The external oblique fascia was easily brought down to the inguinal ligament with interrupted 0 Surgilon sutures.  Toradol was placed in the wound.  The external oblique was closed with a running 2-0 Vicryl suture.  Scarpa's fascia was closed with a running 3-0 Vicryl suture.  The skin closed with a running 4-0 Vicryl subarticular suture.  Benzoin, Steri-Strips, Telfa and Tegaderm dressing applied.  The patient tolerated the procedure well and was taken recovery room in stable  condition.

## 2017-04-23 NOTE — Progress Notes (Signed)
Ice pack to left  Lower abdomen as ordered

## 2017-04-23 NOTE — Anesthesia Procedure Notes (Signed)
Procedure Name: LMA Insertion Performed by: Etna Forquer, CRNA Pre-anesthesia Checklist: Patient identified, Patient being monitored, Timeout performed, Emergency Drugs available and Suction available Patient Re-evaluated:Patient Re-evaluated prior to induction Oxygen Delivery Method: Circle system utilized Preoxygenation: Pre-oxygenation with 100% oxygen Induction Type: IV induction Ventilation: Mask ventilation without difficulty LMA: LMA inserted LMA Size: 3.5 Tube type: Oral Number of attempts: 1 Placement Confirmation: positive ETCO2 and breath sounds checked- equal and bilateral Tube secured with: Tape Dental Injury: Teeth and Oropharynx as per pre-operative assessment        

## 2017-04-23 NOTE — Progress Notes (Signed)
Dr. Byrnett into see 

## 2017-04-26 ENCOUNTER — Encounter: Payer: Self-pay | Admitting: General Surgery

## 2017-04-26 LAB — H. PYLORI BREATH TEST: H pylori Breath Test: NEGATIVE

## 2017-04-26 LAB — SPECIMEN STATUS REPORT

## 2017-05-01 NOTE — Telephone Encounter (Signed)
Pt results were negative for H Pylori and a separate chart message was sent to her.

## 2017-05-02 ENCOUNTER — Encounter: Payer: Self-pay | Admitting: Gastroenterology

## 2017-05-08 ENCOUNTER — Ambulatory Visit (INDEPENDENT_AMBULATORY_CARE_PROVIDER_SITE_OTHER): Payer: BC Managed Care – PPO | Admitting: General Surgery

## 2017-05-08 ENCOUNTER — Encounter: Payer: Self-pay | Admitting: General Surgery

## 2017-05-08 VITALS — BP 104/58 | HR 66 | Resp 14 | Ht 66.0 in | Wt 139.0 lb

## 2017-05-08 DIAGNOSIS — K4090 Unilateral inguinal hernia, without obstruction or gangrene, not specified as recurrent: Secondary | ICD-10-CM

## 2017-05-08 NOTE — Progress Notes (Signed)
Patient ID: Christie Cannon, female   DOB: 13-Jun-1980, 37 y.o.   MRN: [id number]  Chief Complaint  Patient presents with  . Routine Post Op    HPI Christie Cannon is a 37 y.o. female here today for her post op left inguinal hernia repair done on 04/23/2017. Patient states she is doing well. No GI issues.  HPI  Past Medical History:  Diagnosis Date  . Abnormal Pap smear of cervix   . Headache   . Irregular heart beat    IN COLLEGE-CAFFINE AND SUDAFED MAKES IT WORSE    Past Surgical History:  Procedure Laterality Date  . CESAREAN SECTION  2014  . CESAREAN SECTION N/A 07/15/2015   Procedure: CESAREAN SECTION;  Surgeon: Conard NovakStephen D Jackson, MD;  Location: [facility] ORS;  Service: Obstetrics;  Laterality: N/A;  . INGUINAL HERNIA REPAIR Left 04/23/2017   PRIMARY REPAIR LEFT INDIRECT INGUINAL HENRIA:  Surgeon: Earline MayotteByrnett, Clover Feehan W, MD;  Location: [facility] ORS;  Service: General;  Laterality: Left;    Family History  Problem Relation Age of Onset  . Breast cancer Maternal Grandmother   . Colon cancer Maternal Grandmother     Social History Social History   Tobacco Use  . Smoking status: Former Smoker    Packs/day: 1.00    Years: 5.00    Pack years: 5.00    Types: Cigarettes    Last attempt to quit: 07/13/2008    Years since quitting: 8.8  . Smokeless tobacco: Never Used  Substance Use Topics  . Alcohol use: Yes    Alcohol/week: 0.0 oz    Comment: OCC WEEKENDS  . Drug use: No    Allergies  Allergen Reactions  . Penicillin G Rash  . Penicillins Rash    Current Outpatient Medications  Medication Sig Dispense Refill  . Butalbital-APAP-Caffeine 50-325-40 MG capsule Take 1 capsule by mouth every 4 (four) hours as needed for headache. 30 capsule 3  . escitalopram (LEXAPRO) 10 MG tablet Take 1 tablet (10 mg total) by mouth daily. (Patient taking differently: Take 10 mg by mouth at bedtime. ) 90 tablet 3  . levonorgestrel (MIRENA) 20 MCG/24HR IUD 1 each by Intrauterine route once.    .  polyethylene glycol (MIRALAX / GLYCOLAX) packet Take 17 g by mouth daily. PT HAS NOT STARTED YET AS OF 04-19-17     No current facility-administered medications for this visit.     Review of Systems Review of Systems  Constitutional: Negative.   Respiratory: Negative.   Cardiovascular: Negative.   Gastrointestinal: Negative for constipation and diarrhea.    Blood pressure (!) 104/58, pulse 66, resp. rate 14, height 5\' 6"  (1.676 m), weight 139 lb (63 kg), unknown if currently breastfeeding.  Physical Exam Physical Exam  Constitutional: She is oriented to person, place, and time. She appears well-developed and well-nourished.  Abdominal: Soft. No hernia.    Left incision clean, repair intact  Neurological: She is alert and oriented to person, place, and time.  Skin: Skin is warm and dry.  Psychiatric: Her behavior is normal.       Assessment    Doing well post inguinal hernia repair.     Plan    Follow up as needed. The patient is aware to call back for any questions or new concerns. Proper lifting techniques reviewed. Resume activities as tolerated.    HPI, Physical Exam, Assessment and Plan have been scribed under the direction and in the presence of Earline MayotteJeffrey W. Raesha Coonrod, MD. Dorathy DaftMarsha Hatch, RN  I have completed the exam and reviewed the above documentation for accuracy and completeness.  I agree with the above.  Museum/gallery conservator has been used and any errors in dictation or transcription are unintentional.  Donnalee Curry, M.D., F.A.C.S.   Merrily Pew Celvin Taney 05/08/2017, 7:58 PM

## 2017-05-08 NOTE — Patient Instructions (Addendum)
The patient is aware to call back for any questions or new concerns.  Follow up as needed. Proper lifting techniques reviewed. Resume activities as tolerated. 

## 2017-06-04 ENCOUNTER — Telehealth: Payer: Self-pay | Admitting: Gastroenterology

## 2017-06-04 NOTE — Telephone Encounter (Signed)
LEFT VM WITH PT TO CALL OFFICE AND R/S APT DR. TAHILIANI IS SCOPYING

## 2017-06-07 ENCOUNTER — Encounter: Payer: Self-pay | Admitting: Gastroenterology

## 2017-06-07 NOTE — Telephone Encounter (Signed)
LVM for patient to call and reschedule her 07/04/17 appt with Dr Maximino Greenlandahiliani for a 3 month follow up.  Letter sent

## 2017-07-04 ENCOUNTER — Ambulatory Visit: Payer: BC Managed Care – PPO | Admitting: Gastroenterology

## 2018-01-03 ENCOUNTER — Other Ambulatory Visit: Payer: Self-pay | Admitting: Unknown Physician Specialty

## 2018-01-03 DIAGNOSIS — R51 Headache: Principal | ICD-10-CM

## 2018-01-03 DIAGNOSIS — R221 Localized swelling, mass and lump, neck: Secondary | ICD-10-CM

## 2018-01-03 DIAGNOSIS — R519 Headache, unspecified: Secondary | ICD-10-CM

## 2018-01-10 ENCOUNTER — Ambulatory Visit
Admission: RE | Admit: 2018-01-10 | Discharge: 2018-01-10 | Disposition: A | Discharge status: Home / Self Care | Payer: BC Managed Care – PPO | Source: Ambulatory Visit | Attending: Unknown Physician Specialty | Admitting: Unknown Physician Specialty

## 2018-01-10 DIAGNOSIS — R221 Localized swelling, mass and lump, neck: Secondary | ICD-10-CM | POA: Diagnosis not present

## 2018-01-23 ENCOUNTER — Ambulatory Visit
Admission: RE | Admit: 2018-01-23 | Discharge: 2018-01-23 | Disposition: A | Discharge status: Home / Self Care | Payer: BC Managed Care – PPO | Source: Ambulatory Visit | Attending: Unknown Physician Specialty | Admitting: Unknown Physician Specialty

## 2018-01-23 DIAGNOSIS — R519 Headache, unspecified: Secondary | ICD-10-CM

## 2018-01-23 DIAGNOSIS — R51 Headache: Secondary | ICD-10-CM | POA: Diagnosis not present

## 2018-01-23 MED ORDER — GADOBUTROL 1 MMOL/ML IV SOLN
6.5000 mL | Freq: Once | INTRAVENOUS | Status: AC | PRN
  Administered 2018-01-23: 6.5 mL via INTRAVENOUS

## 2018-03-01 ENCOUNTER — Other Ambulatory Visit: Payer: Self-pay | Admitting: Obstetrics and Gynecology

## 2018-03-01 DIAGNOSIS — F3281 Premenstrual dysphoric disorder: Secondary | ICD-10-CM

## 2018-04-05 ENCOUNTER — Telehealth: Payer: Self-pay

## 2018-04-05 ENCOUNTER — Other Ambulatory Visit: Payer: Self-pay | Admitting: Obstetrics and Gynecology

## 2018-04-05 DIAGNOSIS — F3281 Premenstrual dysphoric disorder: Secondary | ICD-10-CM

## 2018-04-05 MED ORDER — ESCITALOPRAM OXALATE 10 MG PO TABS: 10.0000 mg | ORAL_TABLET | Freq: Every day | ORAL | 0 refills | Status: DC

## 2018-04-05 NOTE — Telephone Encounter (Signed)
Why would you send this to me in ABC's absence? I am the one who prescribed it to her in the first place.  I sent in an rx to cover her.

## 2018-04-05 NOTE — Telephone Encounter (Signed)
Please advise in ABC's absence. 

## 2018-04-05 NOTE — Telephone Encounter (Signed)
Pt is currently on her way to [place], [place].  She left her lexapro and wants to know if you could call in just 3 pills to [facility] at [address]  [phone]

## 2018-04-08 NOTE — Telephone Encounter (Signed)
Called pt and left detailed msg. 

## 2019-01-06 ENCOUNTER — Other Ambulatory Visit: Payer: Self-pay

## 2019-01-06 DIAGNOSIS — Z20822 Contact with and (suspected) exposure to covid-19: Secondary | ICD-10-CM

## 2019-01-08 LAB — NOVEL CORONAVIRUS, NAA: SARS-CoV-2, NAA: NOT DETECTED

## 2019-01-30 IMAGING — US US SOFT TISSUE HEAD/NECK
1 series · 14 of 17 positions shown · non-contrast
Comparison: None.

CLINICAL DATA: Left neck mass.

EXAM:
ULTRASOUND OF HEAD/NECK SOFT TISSUES
TECHNIQUE: Ultrasound examination of the head and neck soft tissues was
performed in the area of clinical concern.

[Series 1: us soft tissue head/neck · 17 acquisitions, 14 frames shown]
[im 1/17]
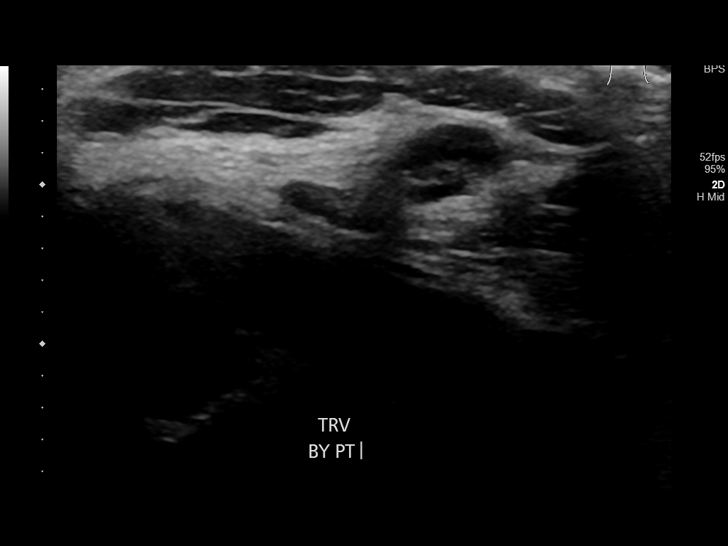
[im 2/17]
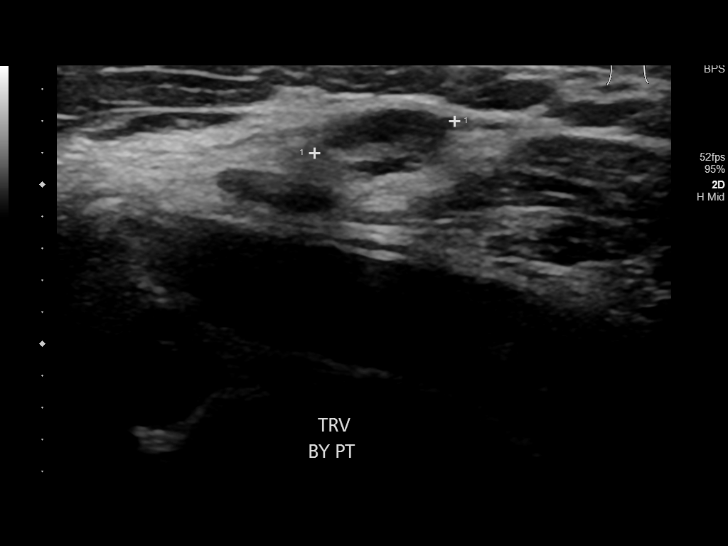
[im 4/17]
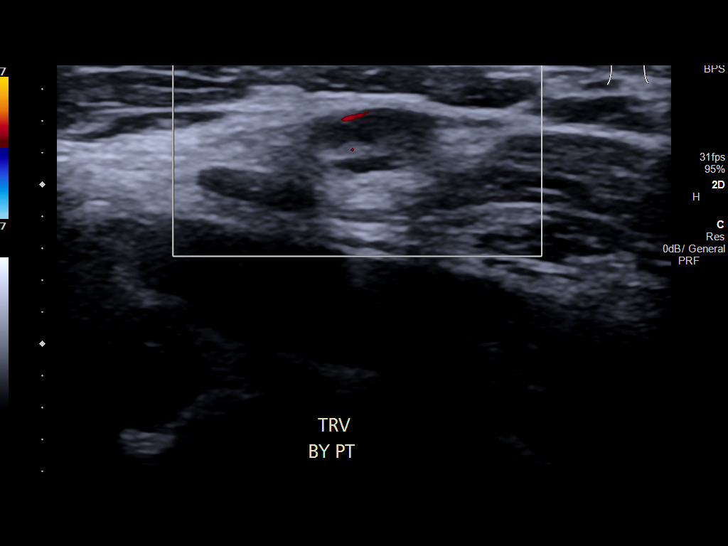
[im 5/17]
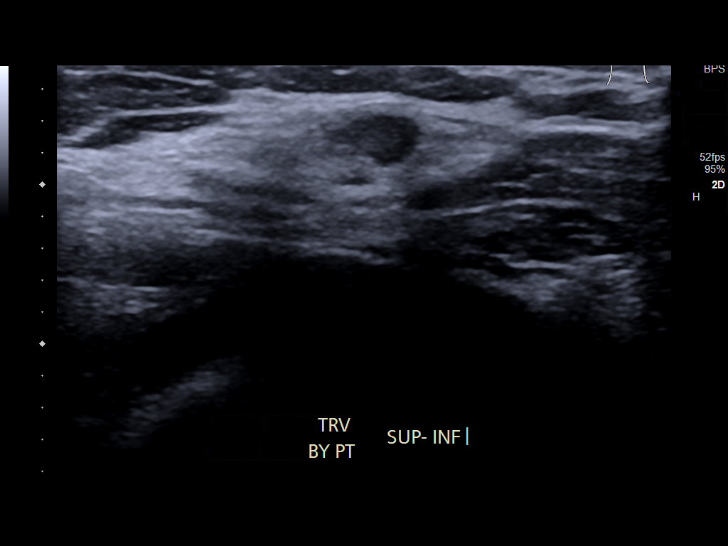
[im 6/17]
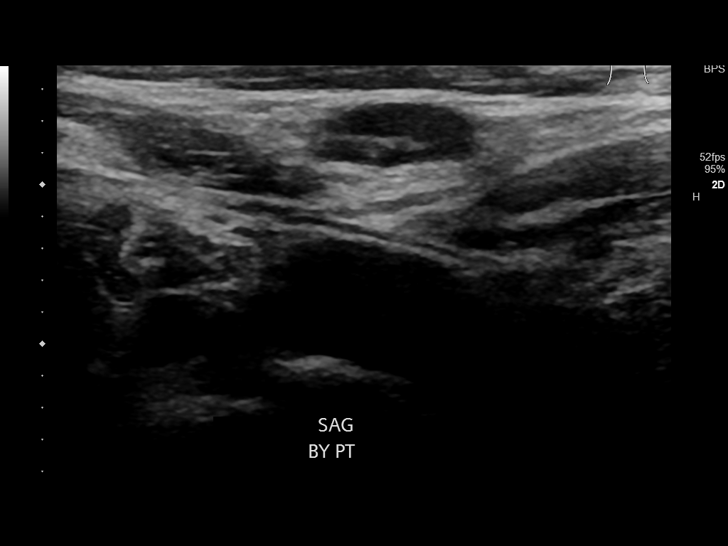
[im 7/17]
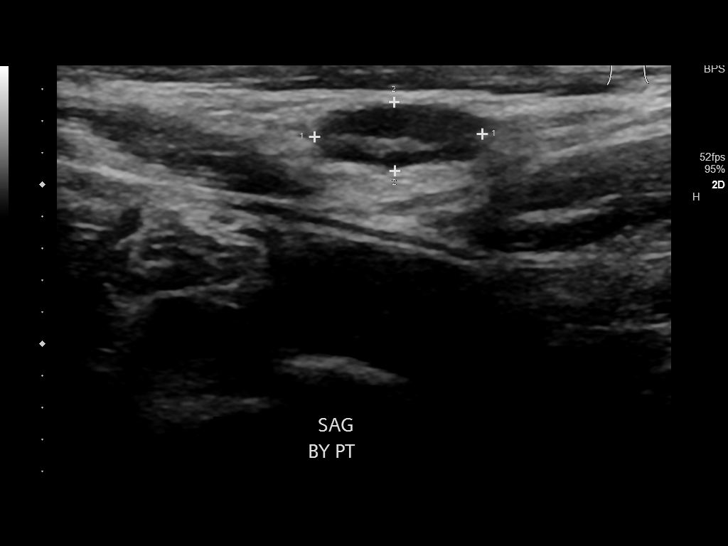
[im 8/17]
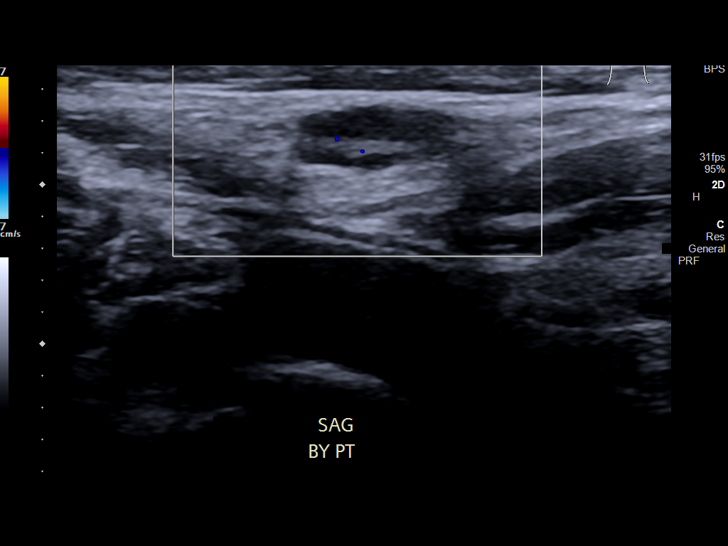
[im 10/17]
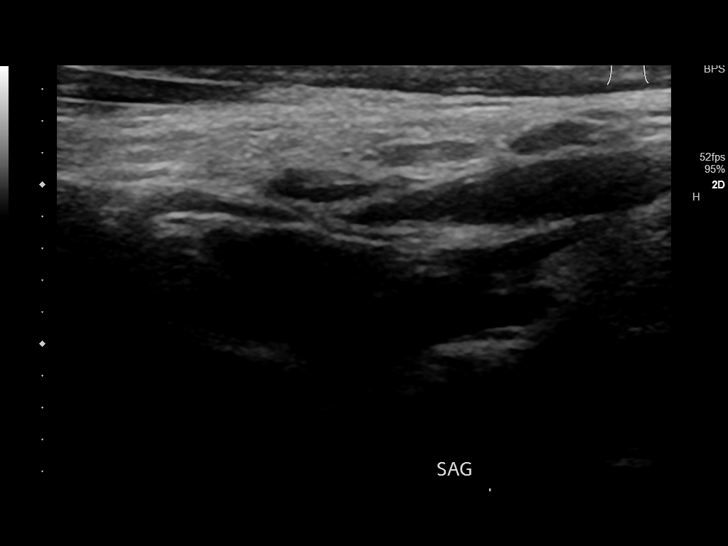
[im 11/17]
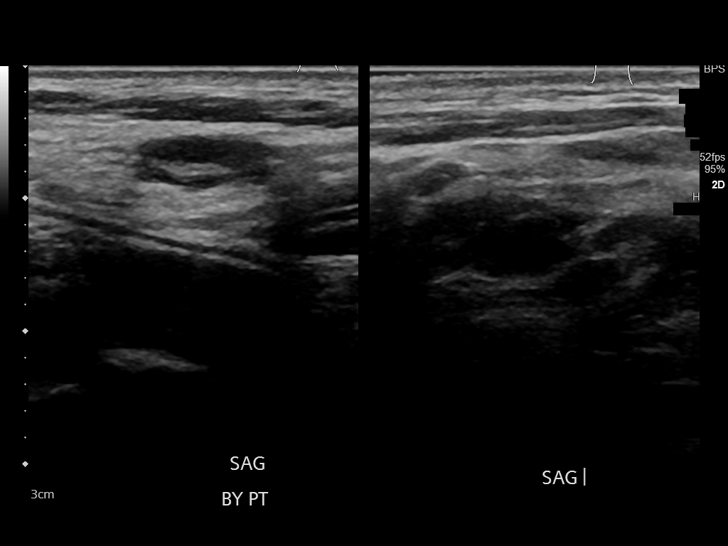
[im 12/17]
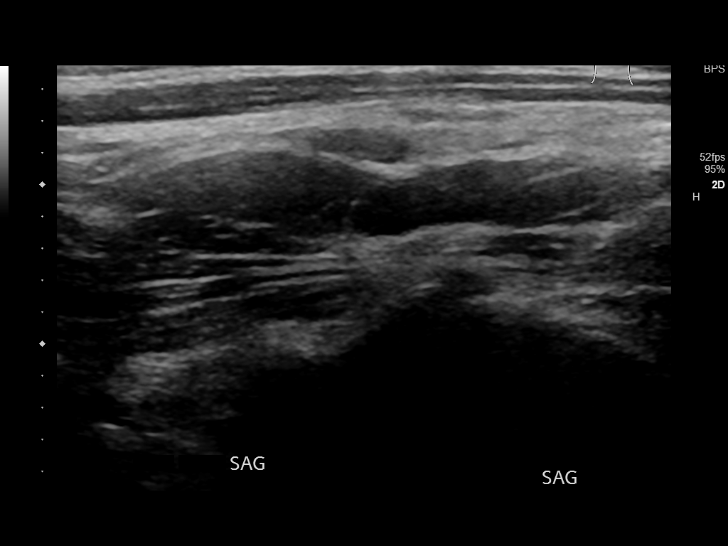
[im 13/17]
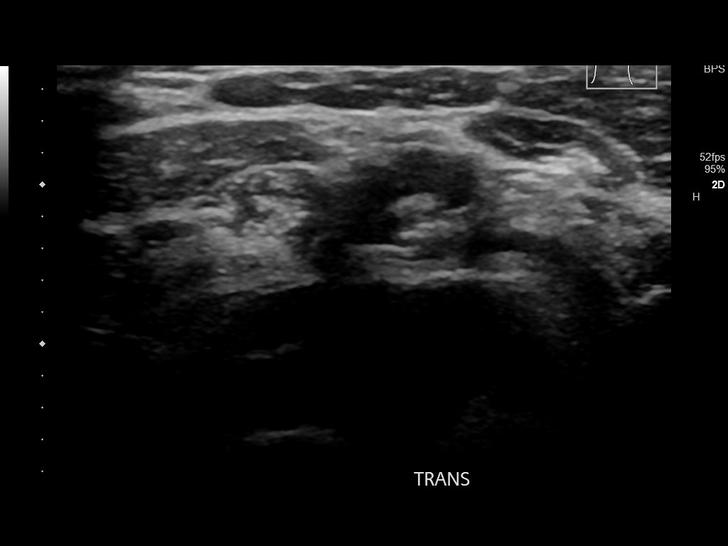
[im 14/17]
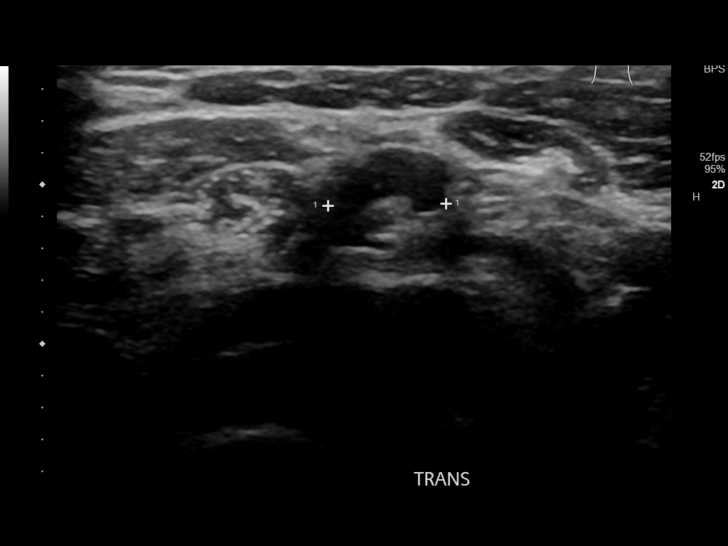
[im 16/17]
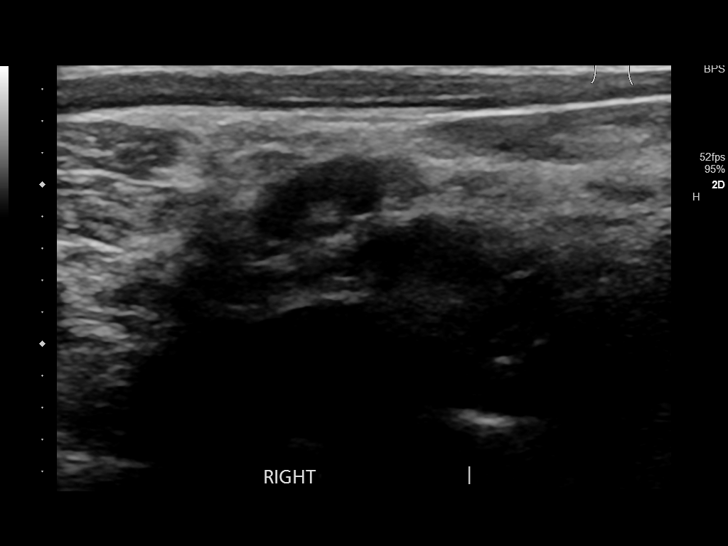
[im 17/17]
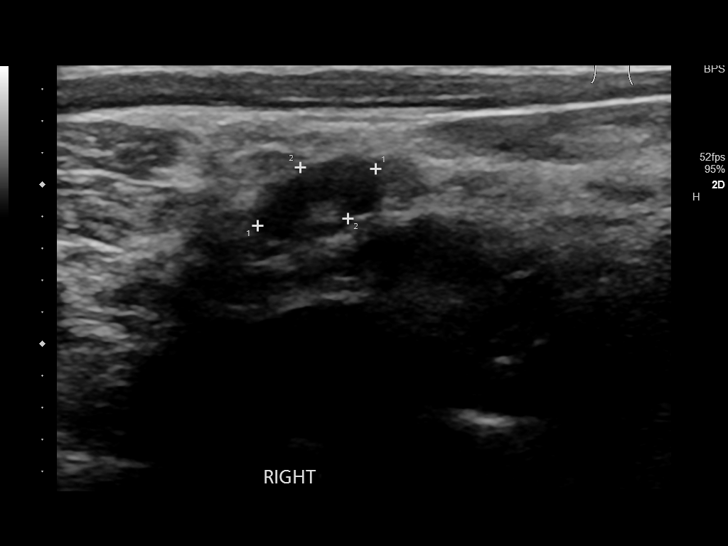

[14 of 17 positions shown; findings below may reference images not displayed]

FINDINGS: Ultrasound of the left neck in the area of clinical concern
demonstrates an 11 x 4 x 9 mm mass near the angle of the mandible
with an echogenic hilum most compatible with a lymph node without
suspicious morphology. An 8 x 4 x 7 mm lymph node was noted at this
level on the right side. No suspicious mass or fluid collection was
identified.
IMPRESSION: 11 x 4 mm lymph node near the angle of the mandible on the left. No
suspicious mass identified.

## 2019-02-17 ENCOUNTER — Other Ambulatory Visit (HOSPITAL_COMMUNITY)
Admission: RE | Admit: 2019-02-17 | Discharge: 2019-02-17 | Disposition: A | Discharge status: Home / Self Care | Payer: BC Managed Care – PPO | Source: Ambulatory Visit | Attending: Obstetrics and Gynecology | Admitting: Obstetrics and Gynecology

## 2019-02-17 ENCOUNTER — Encounter: Payer: Self-pay | Admitting: Obstetrics and Gynecology

## 2019-02-17 ENCOUNTER — Ambulatory Visit (INDEPENDENT_AMBULATORY_CARE_PROVIDER_SITE_OTHER): Payer: BC Managed Care – PPO | Admitting: Obstetrics and Gynecology

## 2019-02-17 ENCOUNTER — Other Ambulatory Visit: Payer: Self-pay

## 2019-02-17 VITALS — BP 100/60 | Ht 66.0 in | Wt 149.0 lb

## 2019-02-17 DIAGNOSIS — Z1339 Encounter for screening examination for other mental health and behavioral disorders: Secondary | ICD-10-CM

## 2019-02-17 DIAGNOSIS — Z01419 Encounter for gynecological examination (general) (routine) without abnormal findings: Secondary | ICD-10-CM

## 2019-02-17 DIAGNOSIS — N930 Postcoital and contact bleeding: Secondary | ICD-10-CM

## 2019-02-17 DIAGNOSIS — Z124 Encounter for screening for malignant neoplasm of cervix: Secondary | ICD-10-CM

## 2019-02-17 DIAGNOSIS — R1032 Left lower quadrant pain: Secondary | ICD-10-CM

## 2019-02-17 DIAGNOSIS — Z1331 Encounter for screening for depression: Secondary | ICD-10-CM

## 2019-02-17 DIAGNOSIS — Z23 Encounter for immunization: Secondary | ICD-10-CM | POA: Diagnosis not present

## 2019-02-17 LAB — HM PAP SMEAR: HM Pap smear: NORMAL

## 2019-02-17 NOTE — Progress Notes (Signed)
Gynecology Annual Exam  PCP: Patient, No Pcp Per  Chief Complaint  Patient presents with  . Gynecologic Exam    bleeding after intercourse , LLQ pain   History of Present Illness:  Ms. Awanda MinkRhonda R Griffy is a 38 y.o. [phone] who LMP was No LMP recorded. (Menstrual status: IUD)., presents today for her annual examination.  Her menses are inconsistent with her IUD.  The bleeding comes and goes. She has spotting. She mainly notes when she goes to the restroom.  She has had postcoital bleeding for about 6 months.  This bleeding is a little heavier than the bleeding she has unprovoked, but still not enough for use of a tampon.   She is sexually active.  No pain with intercourse.  Last Pap: 4+ years.  Results were: no abnormalities /neg HPV DNA negative Hx of STDs: none  Last mammogram: n/a There is no FH of breast cancer. There is no FH of ovarian cancer.   She has begun to have her left lower quadrant pain again.  The pain in her left side started up again last week.  The pain is in her left lower quadrant. The pain is not always present.  Aggravating factors: pushing on the area.  Alleviating factors:  Time.  Associated symptoms: none.  Specifically, she denies diarrhea, constipation, blood in her stool.  She started a probiotic three months ago. This helped her with the bloating.    Tobacco use: The patient denies current or previous tobacco use. Alcohol use: social drinker Exercise: inconsistent   The patient wears seatbelts: yes.      Past Medical History:  Diagnosis Date  . Abnormal Pap smear of cervix   . Headache   . Irregular heart beat    IN COLLEGE-CAFFINE AND SUDAFED MAKES IT WORSE    Past Surgical History:  Procedure Laterality Date  . CESAREAN SECTION  2014  . CESAREAN SECTION N/A 07/15/2015   Procedure: CESAREAN SECTION;  Surgeon: Conard NovakStephen D Atari Novick, MD;  Location: [facility];  Service: Obstetrics;  Laterality: N/A;  . INGUINAL HERNIA REPAIR Left 04/23/2017   PRIMARY REPAIR  LEFT INDIRECT INGUINAL HENRIA:  Surgeon: Earline MayotteByrnett, Jeffrey W, MD;  Location: [facility];  Service: General;  Laterality: Left;    Prior to Admission medications   Medication Sig Start Date End Date Taking? Authorizing Provider  Butalbital-APAP-Caffeine 50-325-40 MG capsule Take 1 capsule by mouth every 4 (four) hours as needed for headache. 02/21/17   Conard NovakJackson, Baani Bober D, MD  escitalopram (LEXAPRO) 10 MG tablet Take 1 tablet (10 mg total) by mouth at bedtime. 04/05/18   Conard NovakJackson, Nera Haworth D, MD  levonorgestrel (MIRENA) 20 MCG/24HR IUD 1 each by Intrauterine route once.    [provider]    Allergies  Allergen Reactions  . Penicillin G Rash  . Penicillins Rash    Gynecologic History: No LMP recorded. (Menstrual status: IUD).  Obstetric History: [id number]  Social History   Socioeconomic History  . Marital status: Married    Spouse name: Not on file  . Number of children: Not on file  . Years of education: Not on file  . Highest education level: Not on file  Occupational History  . Not on file  Social Needs  . Financial resource strain: Not on file  . Food insecurity    Worry: Not on file    Inability: Not on file  . Transportation needs    Medical: Not on file    Non-medical: Not on file  Tobacco  Use  . Smoking status: Former Smoker    Packs/day: 1.00    Years: 5.00    Pack years: 5.00    Types: Cigarettes    Quit date: 07/13/2008    Years since quitting: 10.6  . Smokeless tobacco: Never Used  Substance and Sexual Activity  . Alcohol use: Yes    Alcohol/week: 0.0 standard drinks    Comment: OCC WEEKENDS  . Drug use: No  . Sexual activity: Yes    Birth control/protection: I.U.D.  Lifestyle  . Physical activity    Days per week: Not on file    Minutes per session: Not on file  . Stress: Not on file  Relationships  . Social Musician on phone: Not on file    Gets together: Not on file    Attends religious service: Not on file    Active member of  club or organization: Not on file    Attends meetings of clubs or organizations: Not on file    Relationship status: Not on file  . Intimate partner violence    Fear of current or ex partner: Not on file    Emotionally abused: Not on file    Physically abused: Not on file    Forced sexual activity: Not on file  Other Topics Concern  . Not on file  Social History Narrative  . Not on file    Family History  Problem Relation Age of Onset  . Esophageal cancer Father   . Breast cancer Maternal Grandmother   . Colon cancer Maternal Grandmother     Review of Systems  Constitutional: Negative.   HENT: Negative.   Eyes: Negative.   Respiratory: Negative.   Cardiovascular: Negative.   Gastrointestinal: Negative.   Genitourinary: Negative.   Musculoskeletal: Negative.   Skin: Negative.   Neurological: Negative.   Psychiatric/Behavioral: Negative.      Physical Exam BP 100/60   Ht  (1.676 m)   Wt 149 lb (67.6 kg)   BMI 24.05 kg/m    Physical Exam Constitutional:      General: She is not in acute distress.    Appearance: Normal appearance. She is well-developed.  Genitourinary:     Pelvic exam was performed with patient in the lithotomy position.     Vulva, urethra, bladder and uterus normal.     No inguinal adenopathy present in the right or left side.    No signs of injury in the vagina.     No vaginal discharge, erythema, tenderness or bleeding.     No cervical motion tenderness, discharge, lesion or polyp.     No IUD strings visualized.     Uterus is mobile.     Uterus is not enlarged or tender.     No uterine mass detected.    Uterus is anteverted.     No right or left adnexal mass present.     Right adnexa not tender or full.     Left adnexa not tender or full.     Genitourinary Comments: Mild-moderate old brown blood in vaginal vault. Cervix began to bleed after cytobrush on pap smear.  Silver nitrate applied to just inside cervical os and this stopped her  bleeding.  IUD strings not seen again. She has had a pelvic ultrasound demonstrating the IUD being in place after an exam where no IUD string was visualized.   HENT:     Head: Normocephalic and atraumatic.  Eyes:  General: No scleral icterus.    Conjunctiva/sclera: Conjunctivae normal.  Neck:     Musculoskeletal: Normal range of motion and neck supple.     Thyroid: No thyromegaly.  Cardiovascular:     Rate and Rhythm: Normal rate and regular rhythm.     Heart sounds: No murmur. No friction rub. No gallop.   Pulmonary:     Effort: Pulmonary effort is normal. No respiratory distress.     Breath sounds: Normal breath sounds. No wheezing or rales.  Chest:     Breasts:        Right: No inverted nipple, mass, nipple discharge, skin change or tenderness.        Left: No inverted nipple, mass, nipple discharge, skin change or tenderness.  Abdominal:     General: Bowel sounds are normal. There is no distension.     Palpations: Abdomen is soft. There is no mass.     Tenderness: There is no abdominal tenderness. There is no guarding or rebound.  Musculoskeletal: Normal range of motion.        General: No swelling or tenderness.  Lymphadenopathy:     Cervical: No cervical adenopathy.     Lower Body: No right inguinal adenopathy. No left inguinal adenopathy.  Neurological:     General: No focal deficit present.     Mental Status: She is alert and oriented to person, place, and time.     Cranial Nerves: No cranial nerve deficit.  Skin:    General: Skin is warm and dry.     Findings: No erythema or rash.  Psychiatric:        Mood and Affect: Mood normal.        Behavior: Behavior normal.        Judgment: Judgment normal.  Vitals signs reviewed.     Female chaperone present for pelvic and breast  portions of the physical exam  Results: AUDIT Questionnaire (screen for alcoholism): 6 PHQ-9: 1   Assessment: 38 y.o. G14P3003 female here for routine annual gynecologic  examination.  Plan: Problem List Items Addressed This Visit      Other   Intermittent left lower quadrant abdominal pain    Other Visit Diagnoses    Women's annual routine gynecological examination    -  Primary   Relevant Orders   Cytology - PAP   Screening for depression       Screening for alcoholism       Pap smear for cervical cancer screening       Relevant Orders   Cytology - PAP   Need for immunization against influenza       Relevant Orders   Flu Vaccine QUAD 36+ mos IM (Completed)   Postcoital bleeding          Screening: -- Blood pressure screen normal -- Colonoscopy - not due -- Mammogram - not due -- Weight screening: normal -- Depression screening negative (PHQ-9) -- Nutrition: normal -- cholesterol screening: not due for screening -- osteoporosis screening: not due -- tobacco screening: not using -- alcohol screening: AUDIT questionnaire indicates low-risk usage. -- family history of breast cancer screening: done. not at high risk. -- no evidence of domestic violence or intimate partner violence. -- STD screening: gonorrhea/chlamydia NAAT not collected per patient request. -- pap smear collected per ASCCP guidelines -- flu vaccine received today  Postcoital bleeding: likely due to an ectropion. Treated with silver nitrate today. If symptoms continue, will obtain a pelvic ultrasound.  Left lower quadrant pain:  this pain is similar in nature to the pain she had prior to her most recent pregnancy. It has only been present a week. This has been negative on workup in the past. Mutual decision made to continue to monitor for the time being.  If symptoms continue, from a gynecologic standpoint will obtain a pelvic ultrasound.  May consider a CT after, if normal.   Prentice Docker, MD 02/17/2019 5:02 PM

## 2019-02-20 LAB — CYTOLOGY - PAP
Comment: NEGATIVE
Diagnosis: NEGATIVE
High risk HPV: NEGATIVE

## 2019-04-01 ENCOUNTER — Other Ambulatory Visit: Payer: Self-pay | Admitting: Obstetrics and Gynecology

## 2019-04-01 DIAGNOSIS — F3281 Premenstrual dysphoric disorder: Secondary | ICD-10-CM

## 2019-04-01 NOTE — Telephone Encounter (Signed)
Please advise. Annual was 01/2019, but last refill was 03/2018

## 2019-04-18 ENCOUNTER — Inpatient Hospital Stay
Admission: RE | Admit: 2019-04-18 | Discharge: 2019-04-18 | Disposition: A | Discharge status: Home / Self Care | Payer: BC Managed Care – PPO | Source: Ambulatory Visit

## 2019-12-16 ENCOUNTER — Other Ambulatory Visit: Payer: Self-pay | Admitting: Family Medicine

## 2019-12-16 ENCOUNTER — Other Ambulatory Visit (HOSPITAL_COMMUNITY): Payer: Self-pay | Admitting: Family Medicine

## 2019-12-16 DIAGNOSIS — R1032 Left lower quadrant pain: Secondary | ICD-10-CM

## 2019-12-16 DIAGNOSIS — R109 Unspecified abdominal pain: Secondary | ICD-10-CM

## 2019-12-18 ENCOUNTER — Encounter: Payer: Self-pay | Admitting: Obstetrics and Gynecology

## 2019-12-18 ENCOUNTER — Other Ambulatory Visit: Payer: Self-pay

## 2019-12-18 ENCOUNTER — Ambulatory Visit (INDEPENDENT_AMBULATORY_CARE_PROVIDER_SITE_OTHER): Payer: BC Managed Care – PPO | Admitting: Obstetrics and Gynecology

## 2019-12-18 ENCOUNTER — Ambulatory Visit
Admission: RE | Admit: 2019-12-18 | Discharge: 2019-12-18 | Disposition: A | Discharge status: Home / Self Care | Payer: BC Managed Care – PPO | Source: Ambulatory Visit | Attending: Family Medicine | Admitting: Family Medicine

## 2019-12-18 VITALS — BP 109/72 | HR 69 | Ht 66.0 in | Wt 142.0 lb

## 2019-12-18 DIAGNOSIS — R1032 Left lower quadrant pain: Secondary | ICD-10-CM

## 2019-12-18 DIAGNOSIS — R109 Unspecified abdominal pain: Secondary | ICD-10-CM | POA: Diagnosis not present

## 2019-12-18 DIAGNOSIS — Z30432 Encounter for removal of intrauterine contraceptive device: Secondary | ICD-10-CM

## 2019-12-18 MED ORDER — IOHEXOL 300 MG/ML  SOLN
100.0000 mL | Freq: Once | INTRAMUSCULAR | Status: AC | PRN
  Administered 2019-12-18: 10:00:00 100 mL via INTRAVENOUS

## 2019-12-18 NOTE — Progress Notes (Signed)
Obstetrics & Gynecology Office Visit   Chief Complaint:  Chief Complaint  Patient presents with   Follow-up    Mirena IUD check, pelvic pain x 3 weeks, CT scan today    History of Present Illness: 39 y.o. G15P3003 female with 3 week history of LLQ pain.  Sitting seems to make it worse and pressing on it. The pain radiates through her back.  Time makes it feel better.  She notes no associated symptoms. She had a CT this morning that appears normal apart from some gas in her colon, which does not seem overly distended.  Her IUD was in the correct location, though her uterus was deviated to the right somewhat.  Her IUD strings were not visualized at her last appointment.   Past Medical History:  Diagnosis Date   Abnormal Pap smear of cervix    Headache    Irregular heart beat    IN COLLEGE-CAFFINE AND SUDAFED MAKES IT WORSE    Past Surgical History:  Procedure Laterality Date   CESAREAN SECTION  2014   CESAREAN SECTION N/A 07/15/2015   Procedure: CESAREAN SECTION;  Surgeon: Conard Novak, MD;  Location: [facility];  Service: Obstetrics;  Laterality: N/A;   INGUINAL HERNIA REPAIR Left 04/23/2017   PRIMARY REPAIR LEFT INDIRECT INGUINAL HENRIA:  Surgeon: Earline Mayotte, MD;  Location: [facility];  Service: General;  Laterality: Left;    Gynecologic History: No LMP recorded. (Menstrual status: IUD).  Obstetric History: [id number]  Family History  Problem Relation Age of Onset   Esophageal cancer Father    Breast cancer Maternal Grandmother    Colon cancer Maternal Grandmother     Social History   Socioeconomic History   Marital status: Married    Spouse name: Not on file   Number of children: Not on file   Years of education: Not on file   Highest education level: Not on file  Occupational History   Not on file  Tobacco Use   Smoking status: Former Smoker    Packs/day: 1.00    Years: 5.00    Pack years: 5.00    Types: Cigarettes    Quit date: 07/13/2008     Years since quitting: 11.4   Smokeless tobacco: Never Used  Vaping Use   Vaping Use: Never used  Substance and Sexual Activity   Alcohol use: Yes    Alcohol/week: 0.0 standard drinks    Comment: OCC WEEKENDS   Drug use: No   Sexual activity: Yes    Birth control/protection: I.U.D.  Other Topics Concern   Not on file  Social History Narrative   Not on file   Social Determinants of Health   Financial Resource Strain:    Difficulty of Paying Living Expenses: Not on file  Food Insecurity:    Worried About Running Out of Food in the Last Year: Not on file   Ran Out of Food in the Last Year: Not on file  Transportation Needs:    Lack of Transportation (Medical): Not on file   Lack of Transportation (Non-Medical): Not on file  Physical Activity:    Days of Exercise per Week: Not on file   Minutes of Exercise per Session: Not on file  Stress:    Feeling of Stress : Not on file  Social Connections:    Frequency of Communication with Friends and Family: Not on file   Frequency of Social Gatherings with Friends and Family: Not on file   Attends Religious Services: Not  on file   Active Member of Clubs or Organizations: Not on file   Attends Banker Meetings: Not on file   Marital Status: Not on file  Intimate Partner Violence:    Fear of Current or Ex-Partner: Not on file   Emotionally Abused: Not on file   Physically Abused: Not on file   Sexually Abused: Not on file    Allergies  Allergen Reactions   Penicillin G Rash   Penicillins Rash    Prior to Admission medications   Medication Sig Start Date End Date Taking? Authorizing Provider  Butalbital-APAP-Caffeine 50-325-40 MG capsule Take 1 capsule by mouth every 4 (four) hours as needed for headache. 02/21/17  Yes Conard Novak, MD  escitalopram (LEXAPRO) 10 MG tablet TAKE 1 TABLET BY MOUTH EVERYDAY AT BEDTIME 04/01/19  Yes Conard Novak, MD  levonorgestrel [facility] 20  MCG/24HR IUD 1 each by Intrauterine route once.   Yes [provider]  polyethylene glycol (MIRALAX / GLYCOLAX) packet Take 17 g by mouth daily. PT HAS NOT STARTED YET AS OF 04-19-17   Yes [provider]    Review of Systems  Constitutional: Negative.   HENT: Negative.   Eyes: Negative.   Respiratory: Negative.   Cardiovascular: Negative.   Gastrointestinal: Positive for abdominal pain. Negative for blood in stool, constipation, diarrhea, melena, nausea and vomiting.  Genitourinary: Negative.  Negative for dysuria.  Musculoskeletal: Negative.   Skin: Negative.   Neurological: Negative.   Psychiatric/Behavioral: Negative.      Physical Exam BP 109/72    Pulse 69    Ht 5\' 6"  (1.676 m)    Wt 142 lb (64.4 kg)    BMI 22.92 kg/m  No LMP recorded. (Menstrual status: IUD). Physical Exam Constitutional:      General: She is not in acute distress.    Appearance: Normal appearance.  Genitourinary:     Pelvic exam was performed with patient in the lithotomy position.     Vulva, inguinal canal, urethra, bladder, vagina, cervix, uterus, right adnexa and left adnexa normal.     No IUD strings visualized.     Uterus is anteverted.  HENT:     Head: Normocephalic and atraumatic.  Eyes:     General: No scleral icterus.    Conjunctiva/sclera: Conjunctivae normal.  Neurological:     General: No focal deficit present.     Mental Status: She is alert and oriented to person, place, and time.     Cranial Nerves: No cranial nerve deficit.  Psychiatric:        Mood and Affect: Mood normal.        Behavior: Behavior normal.        Judgment: Judgment normal.   IUD Removal  Patient identified, informed consent performed, consent signed.  Patient was in the dorsal lithotomy position, normal external genitalia was noted.  A speculum was placed in the patient's vagina, normal discharge was noted, no lesions. The cervix was visualized, no lesions, no abnormal discharge.  The strings of  the IUD were not visualized, so the IUD "hook" was introduced into the endometrial cavity and the IUD was grasped and removed in its entirety.  Patient tolerated the procedure well.    Patient is uncertain about what she will use for contraception.   Routine preventative health maintenance measures emphasized.  Discussed using back-up measures for contraception in the mean time.   Female chaperone present for pelvic and breast  portions of the physical exam  Assessment: 39 y.o. G21P3003 female here for  1. Intermittent left lower quadrant abdominal pain   2. Encounter for IUD removal      Plan: Problem List Items Addressed This Visit      Other   Intermittent left lower quadrant abdominal pain - Primary    Other Visit Diagnoses    Encounter for IUD removal         Discussed that removing the IUD was the only non-invasive gynecologic intervention I could perform today. It is uncertain whether this will lead to any improvement in her symptoms. This is a long-standing issue.  If no improvement, she may consider having her IUD replaced.  Given no findings on CT scan, discussed possibility of diagnostic laparoscopy, which may yield a reason for her pain.  However, we discussed that it may not.  She will consider all this after she sees how removing her IUD affects things.   A total of 20 minutes were spent face-to-face with the patient as well as preparation, review, communication, and documentation during this encounter.    Thomasene Mohair, MD 12/18/2019 5:10 PM

## 2019-12-23 ENCOUNTER — Ambulatory Visit: Payer: BC Managed Care – PPO

## 2019-12-24 ENCOUNTER — Other Ambulatory Visit: Payer: Self-pay | Admitting: Family Medicine

## 2019-12-24 DIAGNOSIS — F411 Generalized anxiety disorder: Secondary | ICD-10-CM

## 2019-12-24 DIAGNOSIS — M5416 Radiculopathy, lumbar region: Secondary | ICD-10-CM

## 2020-01-13 ENCOUNTER — Ambulatory Visit: Payer: BC Managed Care – PPO

## 2020-02-17 ENCOUNTER — Telehealth: Payer: Self-pay | Admitting: Obstetrics and Gynecology

## 2020-02-17 ENCOUNTER — Ambulatory Visit: Payer: BC Managed Care – PPO | Admitting: Obstetrics and Gynecology

## 2020-02-17 ENCOUNTER — Ambulatory Visit (INDEPENDENT_AMBULATORY_CARE_PROVIDER_SITE_OTHER): Payer: BC Managed Care – PPO | Admitting: Obstetrics and Gynecology

## 2020-02-17 ENCOUNTER — Other Ambulatory Visit: Payer: Self-pay

## 2020-02-17 ENCOUNTER — Encounter: Payer: Self-pay | Admitting: Obstetrics and Gynecology

## 2020-02-17 VITALS — BP 124/74 | Ht 64.0 in | Wt 141.0 lb

## 2020-02-17 DIAGNOSIS — Z1339 Encounter for screening examination for other mental health and behavioral disorders: Secondary | ICD-10-CM | POA: Diagnosis not present

## 2020-02-17 DIAGNOSIS — Z1331 Encounter for screening for depression: Secondary | ICD-10-CM | POA: Diagnosis not present

## 2020-02-17 DIAGNOSIS — Z01419 Encounter for gynecological examination (general) (routine) without abnormal findings: Secondary | ICD-10-CM | POA: Diagnosis not present

## 2020-02-17 DIAGNOSIS — N6315 Unspecified lump in the right breast, overlapping quadrants: Secondary | ICD-10-CM

## 2020-02-17 NOTE — Telephone Encounter (Signed)
Patient will need Mirena Iud for 03/01/20 at 8:50 am with dr. Jean Rosenthal.

## 2020-02-17 NOTE — Progress Notes (Signed)
Gynecology Annual Exam  PCP: Marisue Ivan, MD  Chief Complaint  Patient presents with  . Annual Exam   History of Present Illness:  [name] is a 39 y.o. [phone] who LMP was Patient's last menstrual period was 01/20/2020., presents today for her annual examination.  Her menses are irregular since her IUD was removed.  It took a while for her period to come back.  It was not heavy. She is using condoms for contraception.  She would like to have an IUD.   She is sexually active.  Last Pap: 02/17/2019  Results were: no abnormalities /neg HPV DNA negative Hx of STDs: none  Last mammogram:  n/a There is no FH of breast cancer. There is no FH of ovarian cancer.   Tobacco use: The patient denies current or previous tobacco use. Alcohol use: social drinker Exercise: "chasing children"  The patient wears seatbelts: yes.      The pain that she had in her abdomen has improved.   Past Medical History:  Diagnosis Date  . Abnormal Pap smear of cervix   . Headache   . Irregular heart beat    IN COLLEGE-CAFFINE AND SUDAFED MAKES IT WORSE    Past Surgical History:  Procedure Laterality Date  . CESAREAN SECTION  2014  . CESAREAN SECTION N/A 07/15/2015   Procedure: CESAREAN SECTION;  Surgeon: Conard Novak, MD;  Location: [facility] ORS;  Service: Obstetrics;  Laterality: N/A;  . INGUINAL HERNIA REPAIR Left 04/23/2017   PRIMARY REPAIR LEFT INDIRECT INGUINAL HENRIA:  Surgeon: Earline Mayotte, MD;  Location: [facility] ORS;  Service: General;  Laterality: Left;    Prior to Admission medications   Medication Sig Start Date End Date Taking? Authorizing Provider  cyanocobalamin 1000 MCG tablet Take by mouth.   Yes [provider]  escitalopram (LEXAPRO) 10 MG tablet TAKE 1 TABLET BY MOUTH EVERYDAY AT BEDTIME 04/01/19  Yes Conard Novak, MD  topiramate (TOPAMAX) 50 MG tablet Take by mouth.   Yes [provider]   Allergies  Allergen Reactions  . Penicillin G Rash   . Penicillins Rash   Gynecologic History: Patient's last menstrual period was 01/20/2020.  Obstetric History: [id number], s/p c-section x 3  Social History   Socioeconomic History  . Marital status: Married    Spouse name: Not on file  . Number of children: Not on file  . Years of education: Not on file  . Highest education level: Not on file  Occupational History  . Not on file  Tobacco Use  . Smoking status: Former Smoker    Packs/day: 1.00    Years: 5.00    Pack years: 5.00    Types: Cigarettes    Quit date: 07/13/2008    Years since quitting: 11.6  . Smokeless tobacco: Never Used  Vaping Use  . Vaping Use: Never used  Substance and Sexual Activity  . Alcohol use: Yes    Alcohol/week: 0.0 standard drinks    Comment: OCC WEEKENDS  . Drug use: No  . Sexual activity: Yes    Birth control/protection: I.U.D.  Other Topics Concern  . Not on file  Social History Narrative  . Not on file   Social Determinants of Health   Financial Resource Strain:   . Difficulty of Paying Living Expenses: Not on file  Food Insecurity:   . Worried About Programme researcher, broadcasting/film/video in the Last Year: Not on file  . Ran Out of Food in the Last  Year: Not on file  Transportation Needs:   . Lack of Transportation (Medical): Not on file  . Lack of Transportation (Non-Medical): Not on file  Physical Activity:   . Days of Exercise per Week: Not on file  . Minutes of Exercise per Session: Not on file  Stress:   . Feeling of Stress : Not on file  Social Connections:   . Frequency of Communication with Friends and Family: Not on file  . Frequency of Social Gatherings with Friends and Family: Not on file  . Attends Religious Services: Not on file  . Active Member of Clubs or Organizations: Not on file  . Attends Banker Meetings: Not on file  . Marital Status: Not on file  Intimate Partner Violence:   . Fear of Current or Ex-Partner: Not on file  . Emotionally Abused: Not on file  .  Physically Abused: Not on file  . Sexually Abused: Not on file    Family History  Problem Relation Age of Onset  . Esophageal cancer Father   . Breast cancer Maternal Grandmother   . Colon cancer Maternal Grandmother    Review of Systems  Constitutional: Negative.   HENT: Negative.   Eyes: Negative.   Respiratory: Negative.   Cardiovascular: Negative.   Gastrointestinal: Negative.   Genitourinary: Negative.   Musculoskeletal: Negative.   Skin: Negative.   Neurological: Negative.   Psychiatric/Behavioral: Negative.    Physical Exam BP 124/74   Ht 5\' 4"  (1.626 m)   Wt 141 lb (64 kg)   LMP 01/20/2020   BMI 24.20 kg/m    Physical Exam Constitutional:      General: She is not in acute distress.    Appearance: Normal appearance. She is well-developed.  HENT:     Head: Normocephalic and atraumatic.  Eyes:     General: No scleral icterus.    Conjunctiva/sclera: Conjunctivae normal.  Neck:     Thyroid: No thyromegaly.  Cardiovascular:     Rate and Rhythm: Normal rate and regular rhythm.     Heart sounds: No murmur heard.  No friction rub. No gallop.   Pulmonary:     Effort: Pulmonary effort is normal. No respiratory distress.     Breath sounds: Normal breath sounds. No wheezing or rales.  Chest:     Breasts:        Right: Mass present. No inverted nipple, nipple discharge, skin change or tenderness.        Left: No inverted nipple, mass, nipple discharge, skin change or tenderness.    Abdominal:     General: Bowel sounds are normal. There is no distension.     Palpations: Abdomen is soft. There is no mass.     Tenderness: There is no abdominal tenderness. There is no guarding or rebound.  Musculoskeletal:        General: No swelling or tenderness. Normal range of motion.     Cervical back: Normal range of motion and neck supple.  Lymphadenopathy:     Cervical: No cervical adenopathy.  Neurological:     General: No focal deficit present.     Mental Status: She  is alert and oriented to person, place, and time.     Cranial Nerves: No cranial nerve deficit.  Skin:    General: Skin is warm and dry.     Findings: No erythema or rash.  Psychiatric:        Mood and Affect: Mood normal.  Behavior: Behavior normal.        Judgment: Judgment normal.    Female chaperone present for pelvic and breast  portions of the physical exam  Results: AUDIT Questionnaire (screen for alcoholism): 5 PHQ-9: 3   Assessment: 39 y.o. G86P3003 female here for routine annual gynecologic examination.  Plan: Problem List Items Addressed This Visit    None    Visit Diagnoses    Women's annual routine gynecological examination    -  Primary   Screening for depression       Screening for alcoholism       Breast lump on right side at 3 o'clock position          Screening: -- Blood pressure screen normal -- Colonoscopy - not due -- Mammogram - not due -- Weight screening: normal -- Depression screening negative (PHQ-9) -- Nutrition: normal -- cholesterol screening: not due for screening -- osteoporosis screening: not due -- tobacco screening: not using -- alcohol screening: AUDIT questionnaire indicates low-risk usage. -- family history of breast cancer screening: done. not at high risk. -- no evidence of domestic violence or intimate partner violence. -- STD screening: gonorrhea/chlamydia NAAT not collected per patient request. -- pap smear not collected per ASCCP guidelines -- flu vaccine received -- COVID19 vaccine: has not received  Breast lump: is consistent with lymph tissue. Will recheck when she returns in ~ 1 week. If still present, will get imaging.   Return in about 1 week (around 02/24/2020) for IUD insertion (Mirena).   Thomasene Mohair, MD 02/17/2020 9:03 AM

## 2020-02-23 NOTE — Telephone Encounter (Signed)
Noted. Will order to arrive by apt date/time. 

## 2020-02-26 ENCOUNTER — Telehealth: Payer: Self-pay | Admitting: Obstetrics and Gynecology

## 2020-02-26 NOTE — Telephone Encounter (Signed)
-----   Message from Conard Novak, MD sent at 02/25/2020  6:17 PM EST ----- Regarding: change appt type Hi Huntley Dec,  Would you mind changing her appt type on 12/13 to a NOB instead of an IUD insertion? Thanks!

## 2020-02-26 NOTE — Telephone Encounter (Signed)
Appointment type changed.

## 2020-02-26 NOTE — Telephone Encounter (Signed)
Per SDJ to change appointment type to NOB

## 2020-03-01 ENCOUNTER — Ambulatory Visit (INDEPENDENT_AMBULATORY_CARE_PROVIDER_SITE_OTHER): Payer: BC Managed Care – PPO

## 2020-03-01 ENCOUNTER — Encounter: Payer: Self-pay | Admitting: Obstetrics and Gynecology

## 2020-03-01 ENCOUNTER — Other Ambulatory Visit: Payer: Self-pay

## 2020-03-01 ENCOUNTER — Other Ambulatory Visit: Payer: Self-pay | Admitting: Obstetrics and Gynecology

## 2020-03-01 ENCOUNTER — Other Ambulatory Visit (HOSPITAL_COMMUNITY)
Admission: RE | Admit: 2020-03-01 | Discharge: 2020-03-01 | Disposition: A | Discharge status: Home / Self Care | Payer: BC Managed Care – PPO | Source: Ambulatory Visit | Attending: Obstetrics and Gynecology | Admitting: Obstetrics and Gynecology

## 2020-03-01 ENCOUNTER — Ambulatory Visit (INDEPENDENT_AMBULATORY_CARE_PROVIDER_SITE_OTHER): Payer: BC Managed Care – PPO | Admitting: Obstetrics and Gynecology

## 2020-03-01 VITALS — BP 122/70 | Wt 140.0 lb

## 2020-03-01 DIAGNOSIS — O0991 Supervision of high risk pregnancy, unspecified, first trimester: Secondary | ICD-10-CM | POA: Diagnosis present

## 2020-03-01 DIAGNOSIS — Z3A01 Less than 8 weeks gestation of pregnancy: Secondary | ICD-10-CM

## 2020-03-01 DIAGNOSIS — Z113 Encounter for screening for infections with a predominantly sexual mode of transmission: Secondary | ICD-10-CM | POA: Diagnosis present

## 2020-03-01 DIAGNOSIS — O0990 Supervision of high risk pregnancy, unspecified, unspecified trimester: Secondary | ICD-10-CM | POA: Insufficient documentation

## 2020-03-01 DIAGNOSIS — O34219 Maternal care for unspecified type scar from previous cesarean delivery: Secondary | ICD-10-CM | POA: Insufficient documentation

## 2020-03-01 DIAGNOSIS — O09529 Supervision of elderly multigravida, unspecified trimester: Secondary | ICD-10-CM | POA: Insufficient documentation

## 2020-03-01 NOTE — Progress Notes (Signed)
New Obstetric Patient H&P   Chief Complaint: "Desires prenatal care"  History of Present Illness: Patient is a 39 y.o. [phone] Not Hispanic or Latino female, unsre LMP 01/20/2020 (maybe in October) presents with amenorrhea and positive home pregnancy test. Based on her  LMP, her EDD is Estimated Date of Delivery: 10/26/20 and her EGA is [redacted]w[redacted]d. Cycles are irregular.  Her last pap smear was 1 year ago and was no abnormalities.    She had a urine pregnancy test which was positive 1 week(s)  ago. Since her LMP she claims she has experienced no issues. She denies vaginal bleeding. Her past medical history is noncontributory. Her prior pregnancies are notable for SVD wth G1 (4the degree laceration), C/S x 2 with G2/G3.    Since her LMP, she admits to the use of tobacco products  no She claims she has gained zero pounds since the start of her pregnancy.  There are cats in the home in the home  Yes. She does not change the litter box.  She admits close contact with children on a regular basis  yes  She has had chicken pox in the past yes She has had Tuberculosis exposures, symptoms, or previously tested positive for TB   no Current or past history of domestic violence. no  Genetic Screening/Teratology Counseling: (Includes patient, baby's father, or anyone in either family with:)   1. Patient's age >/= 39 [facility]  yes 2. Thalassemia ([place], [place], Mediterranean, or Asian background): MCV<80  no 3. Neural tube defect (meningomyelocele, spina bifida, anencephaly)  no 4. Congenital heart defect  no  5. Down syndrome  no 6. Tay-Sachs (Jewish, [place]  no 7. Canavan's Disease  no 8. Sickle cell disease or trait (African)  no  9. Hemophilia or other blood disorders  no  10. Muscular dystrophy  no  11. Cystic fibrosis  no  12. Huntington's Chorea  no  13. Mental retardation/autism  no 14. Other inherited genetic or chromosomal disorder  no 15. Maternal metabolic disorder (DM, PKU, etc)   no 16. Patient or FOB with a child with a birth defect not listed above no  16a. Patient or FOB with a birth defect themselves no 17. Recurrent pregnancy loss, or stillbirth  no  18. Any medications since LMP other than prenatal vitamins (include vitamins, supplements, OTC meds, drugs, alcohol) topiramate (last dose about 2 week ago).  B12 1,000 mcg.  Vitamin D.  She is no longer taking vitamin B12 shots.   19. Any other genetic/environmental exposure to discuss  no  Infection History:   1. Lives with someone with TB or TB exposed  no  2. Patient or partner has history of genital herpes  no 3. Rash or viral illness since LMP  no 4. History of STI (GC, CT, HPV, syphilis, HIV)  no 5. History of recent travel :  no  Other pertinent information:  no     Review of Systems:10 point review of systems negative unless otherwise noted in HPI  Past Medical History:  Diagnosis Date  . Abnormal Pap smear of cervix   . Headache   . Irregular heart beat    IN COLLEGE-CAFFINE AND SUDAFED MAKES IT WORSE    Past Surgical History:  Procedure Laterality Date  . CESAREAN SECTION  2014  . CESAREAN SECTION N/A 07/15/2015   Procedure: CESAREAN SECTION;  Surgeon: Conard Novak, MD;  Location: [facility] ORS;  Service: Obstetrics;  Laterality: N/A;  . INGUINAL HERNIA REPAIR Left  04/23/2017   PRIMARY REPAIR LEFT INDIRECT INGUINAL HENRIA:  Surgeon: Earline Mayotte, MD;  Location: [facility] ORS;  Service: General;  Laterality: Left;    Gynecologic History: Patient's last menstrual period was 01/20/2020.  Obstetric History: [id number]  Family History  Problem Relation Age of Onset  . Esophageal cancer Father   . Breast cancer Maternal Grandmother   . Colon cancer Maternal Grandmother     Social History   Socioeconomic History  . Marital status: Married    Spouse name: Not on file  . Number of children: Not on file  . Years of education: Not on file  . Highest education level: Not on file   Occupational History  . Not on file  Tobacco Use  . Smoking status: Former Smoker    Packs/day: 1.00    Years: 5.00    Pack years: 5.00    Types: Cigarettes    Quit date: 07/13/2008    Years since quitting: 11.6  . Smokeless tobacco: Never Used  Vaping Use  . Vaping Use: Never used  Substance and Sexual Activity  . Alcohol use: Yes    Alcohol/week: 0.0 standard drinks    Comment: OCC WEEKENDS  . Drug use: No  . Sexual activity: Yes    Birth control/protection: None  Other Topics Concern  . Not on file  Social History Narrative  . Not on file   Social Determinants of Health   Financial Resource Strain: Not on file  Food Insecurity: Not on file  Transportation Needs: Not on file  Physical Activity: Not on file  Stress: Not on file  Social Connections: Not on file  Intimate Partner Violence: Not on file    Allergies  Allergen Reactions  . Penicillin G Rash  . Penicillins Rash    Prior to Admission medications   Medication Sig Start Date End Date Taking? Authorizing Provider  cyanocobalamin 1000 MCG tablet Take by mouth.   Yes [provider]  escitalopram (LEXAPRO) 10 MG tablet TAKE 1 TABLET BY MOUTH EVERYDAY AT BEDTIME 04/01/19  Yes Conard Novak, MD  polyethylene glycol [facility] / GLYCOLAX) packet Take 17 g by mouth daily. PT HAS NOT STARTED YET AS OF 04-19-17   Yes [provider]  Butalbital-APAP-Caffeine 50-325-40 MG capsule Take 1 capsule by mouth every 4 (four) hours as needed for headache. Patient not taking: No sig reported 02/21/17   Conard Novak, MD  topiramate (TOPAMAX) 50 MG tablet Take by mouth. Patient not taking: Reported on 03/01/2020    [provider]    Physical Exam BP 122/70   Wt 140 lb (63.5 kg)   LMP 01/20/2020   BMI 24.03 kg/m   Physical Exam Exam conducted with a chaperone present.  Constitutional:      General: She is not in acute distress.    Appearance: Normal appearance.  HENT:     Head:  Normocephalic and atraumatic.  Eyes:     General: No scleral icterus.    Conjunctiva/sclera: Conjunctivae normal.  Chest:  Breasts:     Right: Mass (no change in small lump.) present. No swelling, bleeding or inverted nipple.    Genitourinary:    General: Normal vulva.     Exam position: Lithotomy position.     Tanner stage (genital): 5.     Labia:        Right: No rash, tenderness, lesion or injury.        Left: No rash, tenderness, lesion or injury.  Musculoskeletal:        General: No swelling. Normal range of motion.     Cervical back: Normal range of motion. No rigidity.  Skin:    General: Skin is warm and dry.     Coloration: Skin is not jaundiced.  Neurological:     General: No focal deficit present.     Mental Status: She is alert and oriented to person, place, and time.     Cranial Nerves: No cranial nerve deficit.  Psychiatric:        Mood and Affect: Mood normal.        Behavior: Behavior normal.        Judgment: Judgment normal.      Female Chaperone present during breast and/or pelvic exam.   Assessment: 39 y.o. G4P3003 at [redacted]w[redacted]d presenting to initiate prenatal care  Plan: 1) Avoid alcoholic beverages. 2) Patient encouraged not to smoke.  3) Discontinue the use of all non-medicinal drugs and chemicals.  4) Take prenatal vitamins daily.  5) Nutrition, food safety (fish, cheese advisories, and high nitrite foods) and exercise discussed. 6) Hospital and practice style discussed with cross coverage system.  7) Genetic Screening, such as with 1st Trimester Screening, cell free fetal DNA, AFP testing, and Ultrasound, as well as with amniocentesis and CVS as appropriate, is discussed with patient. At the conclusion of today's visit patient undecided genetic testing 8) Patient is asked about travel to areas at risk for the [place] virus, and counseled to avoid travel and exposure to mosquitoes or sexual partners who may have themselves been exposed to the virus. Testing  is discussed, and will be ordered as appropriate.  9) Breast lump, 3 o'clock on right breast: Stable of likely no significance. Will continue to monitor clinically with low threshold to obtain imaging.   Thomasene Mohair, MD 03/01/2020 9:15 AM

## 2020-03-01 NOTE — Addendum Note (Signed)
Addended by: Thomasene Mohair D on: 03/01/2020 12:37 PM   Modules accepted: Orders

## 2020-03-02 LAB — RPR+RH+ABO+RUB AB+AB SCR+CB...
Antibody Screen: NEGATIVE
HIV Screen 4th Generation wRfx: NONREACTIVE
Hematocrit: 44.2 % (ref 34.0–46.6)
Hemoglobin: 14.9 g/dL (ref 11.1–15.9)
Hepatitis B Surface Ag: NEGATIVE
MCH: 32 pg (ref 26.6–33.0)
MCHC: 33.7 g/dL (ref 31.5–35.7)
MCV: 95 fL (ref 79–97)
Platelets: 281 10*3/uL (ref 150–450)
RBC: 4.65 x10E6/uL (ref 3.77–5.28)
RDW: 11.5 % — ABNORMAL LOW (ref 11.7–15.4)
RPR Ser Ql: NONREACTIVE
Rh Factor: POSITIVE
Rubella Antibodies, IGG: 2.81 index (ref >0.99)
Varicella zoster IgG: 923 index (ref >165)
WBC: 5.3 10*3/uL (ref 3.4–10.8)

## 2020-03-02 LAB — BETA HCG QUANT (REF LAB): hCG Quant: 845 m[IU]/mL

## 2020-03-03 ENCOUNTER — Other Ambulatory Visit: Payer: BC Managed Care – PPO

## 2020-03-03 ENCOUNTER — Other Ambulatory Visit: Payer: Self-pay

## 2020-03-03 DIAGNOSIS — Z3A01 Less than 8 weeks gestation of pregnancy: Secondary | ICD-10-CM

## 2020-03-03 DIAGNOSIS — O0991 Supervision of high risk pregnancy, unspecified, first trimester: Secondary | ICD-10-CM

## 2020-03-03 LAB — URINE CULTURE

## 2020-03-03 LAB — CERVICOVAGINAL ANCILLARY ONLY
Chlamydia: NEGATIVE
Comment: NEGATIVE
Comment: NORMAL
Neisseria Gonorrhea: NEGATIVE

## 2020-03-04 LAB — URINE DRUG PANEL 7
Amphetamines, Urine: NEGATIVE ng/mL
Barbiturate Quant, Ur: NEGATIVE ng/mL
Benzodiazepine Quant, Ur: NEGATIVE ng/mL
Cannabinoid Quant, Ur: NEGATIVE ng/mL
Cocaine (Metab.): NEGATIVE ng/mL
Opiate Quant, Ur: NEGATIVE ng/mL
PCP Quant, Ur: NEGATIVE ng/mL

## 2020-03-04 LAB — BETA HCG QUANT (REF LAB): hCG Quant: 1306 m[IU]/mL

## 2020-03-15 ENCOUNTER — Ambulatory Visit (INDEPENDENT_AMBULATORY_CARE_PROVIDER_SITE_OTHER): Payer: BC Managed Care – PPO | Admitting: Obstetrics and Gynecology

## 2020-03-15 ENCOUNTER — Encounter: Payer: Self-pay | Admitting: Obstetrics and Gynecology

## 2020-03-15 ENCOUNTER — Other Ambulatory Visit: Payer: Self-pay

## 2020-03-15 VITALS — BP 126/70 | Wt 140.0 lb

## 2020-03-15 DIAGNOSIS — Z3A01 Less than 8 weeks gestation of pregnancy: Secondary | ICD-10-CM

## 2020-03-15 DIAGNOSIS — O209 Hemorrhage in early pregnancy, unspecified: Secondary | ICD-10-CM

## 2020-03-15 DIAGNOSIS — O34219 Maternal care for unspecified type scar from previous cesarean delivery: Secondary | ICD-10-CM | POA: Diagnosis not present

## 2020-03-15 DIAGNOSIS — O09529 Supervision of elderly multigravida, unspecified trimester: Secondary | ICD-10-CM

## 2020-03-15 DIAGNOSIS — O0991 Supervision of high risk pregnancy, unspecified, first trimester: Secondary | ICD-10-CM | POA: Diagnosis not present

## 2020-03-15 NOTE — Progress Notes (Signed)
  Routine Prenatal Care Visit  Subjective  Christie Cannon is a 39 y.o. [phone] at [redacted]w[redacted]d being seen today for ongoing prenatal care.  She is currently monitored for the following issues for this high-risk pregnancy and has Status post cesarean delivery; Premenstrual dysphoric disorder; Intermittent left lower quadrant abdominal pain; Supervision of high risk pregnancy, antepartum; History of cesarean delivery affecting pregnancy; and Encounter for supervision of high-risk pregnancy with multigravida of advanced maternal age on their problem list.  ----------------------------------------------------------------------------------- Patient reports vaginal bleeding starting 12/24 (3 days ago). She went to [facility] ER and hCG was ~1,800 (it was ~1,300 on 12/15).  Continued bleeding, but lighter. No pain. .   .  ----------------------------------------------------------------------------------- The following portions of the patient's history were reviewed and updated as appropriate: allergies, current medications, past family history, past medical history, past social history, past surgical history and problem list. Problem list updated.  Objective  Blood pressure 126/70, weight 140 lb (63.5 kg), last menstrual period 01/20/2020, unknown if currently breastfeeding. Pregravid weight 140 lb (63.5 kg) Total Weight Gain 0 lb (0 kg) Urinalysis: Urine Protein    Urine Glucose    Fetal Status:           General:  Alert, oriented and cooperative. Patient is in no acute distress.  Skin: Skin is warm and dry. No rash noted.   Cardiovascular: Normal heart rate noted  Respiratory: Normal respiratory effort, no problems with respiration noted  Abdomen: Soft, gravid, appropriate for gestational age.       Pelvic:  Cervical exam deferred        Extremities: Normal range of motion.     Mental Status: Normal mood and affect. Normal behavior. Normal judgment and thought content.   Assessment   39 y.o. G4P3003 at [redacted]w[redacted]d  by  10/26/2020, by Last Menstrual Period presenting for routine prenatal visit  Plan   pregnancy Problems (from 03/01/20 to present)    Problem Noted Resolved   Supervision of high risk pregnancy, antepartum 03/01/2020 by Conard Novak, MD No   History of cesarean delivery affecting pregnancy 03/01/2020 by Conard Novak, MD No   Encounter for supervision of high-risk pregnancy with multigravida of advanced maternal age 37/13/2021 by Conard Novak, MD No       Preterm labor symptoms and general obstetric precautions including but not limited to vaginal bleeding, contractions, leaking of fluid and fetal movement were reviewed in detail with the patient. Please refer to After Visit Summary for other counseling recommendations.   - hCG today.  Will manage based on results today. Discussed possibility of early miscarriage.   No follow-ups on file.   Thomasene Mohair, MD, [facility], [facility] 03/15/2020 3:38 PM

## 2020-03-16 LAB — BETA HCG QUANT (REF LAB): hCG Quant: 1969 m[IU]/mL

## 2020-03-23 ENCOUNTER — Encounter: Payer: BC Managed Care – PPO | Admitting: Obstetrics and Gynecology

## 2020-04-02 ENCOUNTER — Other Ambulatory Visit: Payer: Self-pay | Admitting: Obstetrics and Gynecology

## 2020-04-02 DIAGNOSIS — O034 Incomplete spontaneous abortion without complication: Secondary | ICD-10-CM

## 2020-04-20 ENCOUNTER — Other Ambulatory Visit: Payer: Self-pay | Admitting: Obstetrics and Gynecology

## 2020-04-20 DIAGNOSIS — O034 Incomplete spontaneous abortion without complication: Secondary | ICD-10-CM

## 2020-04-21 ENCOUNTER — Other Ambulatory Visit: Payer: Self-pay

## 2020-04-21 ENCOUNTER — Encounter: Payer: Self-pay | Admitting: Obstetrics and Gynecology

## 2020-04-21 ENCOUNTER — Ambulatory Visit (INDEPENDENT_AMBULATORY_CARE_PROVIDER_SITE_OTHER): Payer: BC Managed Care – PPO | Admitting: Obstetrics and Gynecology

## 2020-04-21 ENCOUNTER — Ambulatory Visit (INDEPENDENT_AMBULATORY_CARE_PROVIDER_SITE_OTHER): Payer: BC Managed Care – PPO

## 2020-04-21 ENCOUNTER — Other Ambulatory Visit: Payer: Self-pay | Admitting: Obstetrics and Gynecology

## 2020-04-21 DIAGNOSIS — Z3A01 Less than 8 weeks gestation of pregnancy: Secondary | ICD-10-CM | POA: Diagnosis not present

## 2020-04-21 DIAGNOSIS — O034 Incomplete spontaneous abortion without complication: Secondary | ICD-10-CM

## 2020-04-21 NOTE — Progress Notes (Signed)
Virtual Visit via Telephone Note  I connected with [name] on 04/21/20 at  3:30 PM EST by telephone and verified that I am speaking with the correct person using two identifiers.   I discussed the limitations, risks, security and privacy concerns of performing an evaluation and management service by telephone and the availability of in person appointments. I also discussed with the patient that there may be a patient responsible charge related to this service. The patient expressed understanding and agreed to proceed.  The patient was at home I spoke with the patient from my  office The names of people involved in this encounter were: [name] and Thomasene Mohair, MD, and Brigid Re, PA-S .   Chief Complaint: Ultrasound follow up for missed abortion  History of Present Illness: Ultrasound demonstrates the following findings Adnexa: no masses seen  Uterus: anteverted with small gestational sac with MSD of 9 mm.  Additional: no abnormalities.   She has no bleeding.    Observations/Objective: Physical Exam could not be performed. Because of the COVID-19 outbreak this visit was performed over the phone and not in person.   Imaging Results US OB Transvaginal  Result Date: 04/21/2020 Patient Name: Christie Cannon DOB: 13-May-1980 MRN: [id number] ULTRASOUND REPORT Location: [facility] Date of Service: 04/21/2020 Indications:missed AB Findings: No viable intrauterine pregnancy visualized. Gestational sac seen measuring 9.77mm, [redacted]w[redacted]d. No yolk sac/fetal pole seen. Right Ovary is normal in appearance. Left Ovary is normal appearance. Survey of the adnexa demonstrates no adnexal masses. There is no free peritoneal fluid in the cul de sac. Impression: 1. Gestational sac seen measuring [redacted]w[redacted]d without yolk sac/fetal pole. Darlina Guys, RT The ultrasound images and findings were reviewed by me and I agree with the above report. Thomasene Mohair, MD, [facility], [facility]  04/21/2020 4:24 PM       Assessment and Plan:   ICD-10-CM   1. Incomplete abortion  O03.4      Follow Up Instructions: Patient desires D&C over treatment with medication  Will schedule for D&C.  Discussed that she might have a high chance success with medication. However, she is concerned about this approach and prefers D&C.     I discussed the assessment and treatment plan with the patient. The patient was provided an opportunity to ask questions and all were answered. The patient agreed with the plan and demonstrated an understanding of the instructions.   The patient was advised to call back or seek an in-person evaluation if the symptoms worsen or if the condition fails to improve as anticipated.  I provided 12 minutes of non-face-to-face time during this encounter.  Thomasene Mohair, MD  [facility], [facility] 04/21/2020 4:24 PM

## 2020-04-22 ENCOUNTER — Telehealth: Payer: Self-pay

## 2020-04-22 NOTE — Telephone Encounter (Signed)
Called pt to schedule surgery - [facility]

## 2020-04-26 ENCOUNTER — Encounter
Admission: RE | Admit: 2020-04-26 | Discharge: 2020-04-26 | Disposition: A | Discharge status: Home / Self Care | Payer: BC Managed Care – PPO | Source: Ambulatory Visit | Attending: Obstetrics and Gynecology | Admitting: Obstetrics and Gynecology

## 2020-04-26 ENCOUNTER — Other Ambulatory Visit: Payer: Self-pay

## 2020-04-26 HISTORY — DX: Anxiety disorder, unspecified: F41.9

## 2020-04-26 HISTORY — DX: Pneumonia, unspecified organism: J18.9

## 2020-04-26 NOTE — Pre-Procedure Instructions (Signed)
Pt reports being symptomatic and tested positive for covid by home test on Apr 09, 2020. Currently reports no symptoms. PAT covid test 04-27-2020

## 2020-04-26 NOTE — Patient Instructions (Addendum)
Your procedure is scheduled on: April 29, 2020 THURSDAY Report to the Registration Desk on the 1st floor of the [facility] To find out your arrival time, please call [phone] between 1PM - 3PM on: Wednesday April 28, 2020  REMEMBER: Instructions that are not followed completely may result in serious medical risk, up to and including death; or upon the discretion of your surgeon and anesthesiologist your surgery may need to be rescheduled.  Do not eat food after midnight the night before surgery.  No gum chewing, lozengers or hard candies.  You may however, drink CLEAR liquids up to 2 hours before you are scheduled to arrive for your surgery. Do not drink anything within 2 hours of your scheduled arrival time.  Clear liquids include: - water  - apple juice without pulp - gatorade (not RED, PURPLE, OR BLUE) - black coffee or tea (Do NOT add milk or creamers to the coffee or tea) Do NOT drink anything that is not on this list.  In addition, your doctor has ordered for you to drink the provided  Ensure Pre-Surgery Clear Carbohydrate Drink  Drinking this carbohydrate drink up to two hours before surgery helps to reduce insulin resistance and improve patient outcomes. Please complete drinking 2 hours prior to scheduled arrival time.  TAKE THESE MEDICATIONS THE MORNING OF SURGERY WITH A SIP OF WATER: NONE  One week prior to surgery: Stop Anti-inflammatories (NSAIDS) such as Advil, Aleve, Ibuprofen, Motrin, Naproxen, Naprosyn and ASPIRIN OR Aspirin based products such as Excedrin, Goodys Powder, BC Powder.    USE TYLENOL ONLY Stop ANY OVER THE COUNTER supplements until after surgery. (However, you may continue taking Vitamin D, Vitamin B, up until the day before surgery.)  No Alcohol for 24 hours before or after surgery.  No Smoking including e-cigarettes for 24 hours prior to surgery.  No chewable tobacco products for at least 6 hours prior to surgery.  No nicotine patches  on the day of surgery.  Do not use any "recreational" drugs for at least a week prior to your surgery.  Please be advised that the combination of cocaine and anesthesia may have negative outcomes, up to and including death. If you test positive for cocaine, your surgery will be cancelled.  On the morning of surgery brush your teeth with toothpaste and water, you may rinse your mouth with mouthwash if you wish. Do not swallow any toothpaste or mouthwash.  Do not wear jewelry, make-up, hairpins, clips or nail polish.  Do not wear lotions, powders, or perfumes.   Do not shave body from the neck down 48 hours prior to surgery just in case you cut yourself which could leave a site for infection.  Also, freshly shaved skin may become irritated if using the CHG soap.  Contact lenses, hearing aids and dentures may not be worn into surgery.  Do not bring valuables to the hospital. [facility] is not responsible for any missing/lost belongings or valuables.   SHOWER MORNING OF SURGERY  Notify your doctor if there is any change in your medical condition (cold, fever, infection).  Wear comfortable clothing (specific to your surgery type) to the hospital.  Plan for stool softeners for home use; pain medications have a tendency to cause constipation. You can also help prevent constipation by eating foods high in fiber such as fruits and vegetables and drinking plenty of fluids as your diet allows.  After surgery, you can help prevent lung complications by doing breathing exercises.  Take deep  breaths and cough every 1-2 hours. Your doctor may order a device called an Incentive Spirometer to help you take deep breaths. When coughing or sneezing, hold a pillow firmly against your incision with both hands. This is called "splinting." Doing this helps protect your incision. It also decreases belly discomfort.   If you are being discharged the day of surgery, you will not be allowed to drive  home. You will need a responsible adult (18 years or older) to drive you home and stay with you that night.   Please call the Pre-admissions Testing Dept. at [phone] if you have any questions about these instructions.  Visitation Policy:  Patients undergoing a surgery or procedure may have one family member or support person with them as long as that person is not COVID-19 positive or experiencing its symptoms.  That person may remain in the waiting area during the procedure.  Inpatient Visitation:    Visiting hours are 7 a.m. to 8 p.m. Patients will be allowed one visitor. The visitor may change daily. The visitor must pass COVID-19 screenings, use hand sanitizer when entering and exiting the patient's room and wear a mask at all times, including in the patient's room. Patients must also wear a mask when staff or their visitor are in the room. Masking is required regardless of vaccination status. Systemwide, no visitors 17 or younger.

## 2020-04-27 ENCOUNTER — Encounter
Admission: RE | Admit: 2020-04-27 | Discharge: 2020-04-27 | Disposition: A | Discharge status: Home / Self Care | Payer: BC Managed Care – PPO | Source: Ambulatory Visit | Attending: Obstetrics and Gynecology | Admitting: Obstetrics and Gynecology

## 2020-04-27 DIAGNOSIS — Z800 Family history of malignant neoplasm of digestive organs: Secondary | ICD-10-CM | POA: Diagnosis not present

## 2020-04-27 DIAGNOSIS — Z01812 Encounter for preprocedural laboratory examination: Secondary | ICD-10-CM | POA: Insufficient documentation

## 2020-04-27 DIAGNOSIS — Z803 Family history of malignant neoplasm of breast: Secondary | ICD-10-CM | POA: Diagnosis not present

## 2020-04-27 DIAGNOSIS — Z79899 Other long term (current) drug therapy: Secondary | ICD-10-CM | POA: Diagnosis not present

## 2020-04-27 DIAGNOSIS — Z880 Allergy status to penicillin: Secondary | ICD-10-CM | POA: Diagnosis not present

## 2020-04-27 DIAGNOSIS — Z87891 Personal history of nicotine dependence: Secondary | ICD-10-CM | POA: Diagnosis not present

## 2020-04-27 DIAGNOSIS — Z20822 Contact with and (suspected) exposure to covid-19: Secondary | ICD-10-CM | POA: Diagnosis not present

## 2020-04-27 DIAGNOSIS — O021 Missed abortion: Secondary | ICD-10-CM | POA: Diagnosis not present

## 2020-04-27 LAB — TYPE AND SCREEN
ABO/RH(D): O POS
Antibody Screen: NEGATIVE
Extend sample reason: UNDETERMINED

## 2020-04-28 LAB — SARS CORONAVIRUS 2 (TAT 6-24 HRS): SARS Coronavirus 2: NEGATIVE

## 2020-04-29 ENCOUNTER — Ambulatory Visit: Payer: BC Managed Care – PPO | Admitting: Anesthesiology

## 2020-04-29 ENCOUNTER — Encounter: Payer: Self-pay | Admitting: Obstetrics and Gynecology

## 2020-04-29 ENCOUNTER — Encounter
Admission: RE | Disposition: A | Discharge status: Home / Self Care | Payer: Self-pay | Source: Home / Self Care | Attending: Obstetrics and Gynecology

## 2020-04-29 ENCOUNTER — Ambulatory Visit
Admission: RE | Admit: 2020-04-29 | Discharge: 2020-04-29 | Disposition: A | Discharge status: Home / Self Care | Payer: BC Managed Care – PPO | Attending: Obstetrics and Gynecology | Admitting: Obstetrics and Gynecology

## 2020-04-29 ENCOUNTER — Other Ambulatory Visit: Payer: Self-pay

## 2020-04-29 DIAGNOSIS — F3281 Premenstrual dysphoric disorder: Secondary | ICD-10-CM

## 2020-04-29 DIAGNOSIS — Z880 Allergy status to penicillin: Secondary | ICD-10-CM | POA: Insufficient documentation

## 2020-04-29 DIAGNOSIS — O021 Missed abortion: Secondary | ICD-10-CM | POA: Diagnosis present

## 2020-04-29 DIAGNOSIS — Z800 Family history of malignant neoplasm of digestive organs: Secondary | ICD-10-CM | POA: Insufficient documentation

## 2020-04-29 DIAGNOSIS — Z87891 Personal history of nicotine dependence: Secondary | ICD-10-CM | POA: Insufficient documentation

## 2020-04-29 DIAGNOSIS — Z803 Family history of malignant neoplasm of breast: Secondary | ICD-10-CM | POA: Insufficient documentation

## 2020-04-29 DIAGNOSIS — Z20822 Contact with and (suspected) exposure to covid-19: Secondary | ICD-10-CM | POA: Insufficient documentation

## 2020-04-29 DIAGNOSIS — Z79899 Other long term (current) drug therapy: Secondary | ICD-10-CM | POA: Insufficient documentation

## 2020-04-29 HISTORY — PX: DILATION AND EVACUATION: SHX1459

## 2020-04-29 SURGERY — DILATION AND EVACUATION, UTERUS
Anesthesia: General

## 2020-04-29 MED ORDER — OXYCODONE HCL 5 MG/5ML PO SOLN: 5.0000 mg | Freq: Once | ORAL | Status: DC | PRN

## 2020-04-29 MED ORDER — DOXYCYCLINE HYCLATE 100 MG IV SOLR
200.0000 mg | INTRAVENOUS | Status: AC
  Administered 2020-04-29: 200 mg via INTRAVENOUS
  Filled 2020-04-29: qty 100

## 2020-04-29 MED ORDER — ESCITALOPRAM OXALATE 10 MG PO TABS: 15.0000 mg | ORAL_TABLET | Freq: Every day | ORAL | Status: AC

## 2020-04-29 MED ORDER — ONDANSETRON HCL 4 MG/2ML IJ SOLN: 4.0000 mg | Freq: Once | INTRAMUSCULAR | Status: DC | PRN

## 2020-04-29 MED ORDER — OXYCODONE HCL 5 MG PO TABS: 5.0000 mg | ORAL_TABLET | Freq: Once | ORAL | Status: DC | PRN

## 2020-04-29 MED ORDER — FENTANYL CITRATE (PF) 100 MCG/2ML IJ SOLN
INTRAMUSCULAR | Status: DC | PRN
  Administered 2020-04-29 (×4): 25 ug via INTRAVENOUS

## 2020-04-29 MED ORDER — MIDAZOLAM HCL 2 MG/2ML IJ SOLN
INTRAMUSCULAR | Status: DC | PRN
  Administered 2020-04-29: 2 mg via INTRAVENOUS

## 2020-04-29 MED ORDER — CHLORHEXIDINE GLUCONATE 0.12 % MT SOLN
OROMUCOSAL | Status: AC
  Administered 2020-04-29: 15 mL via OROMUCOSAL
  Filled 2020-04-29: qty 15

## 2020-04-29 MED ORDER — ONDANSETRON HCL 4 MG/2ML IJ SOLN
INTRAMUSCULAR | Status: DC | PRN
  Administered 2020-04-29: 4 mg via INTRAVENOUS

## 2020-04-29 MED ORDER — HYDROCODONE-ACETAMINOPHEN 5-325 MG PO TABS: 1.0000 | ORAL_TABLET | Freq: Three times a day (TID) | ORAL | 0 refills | Status: AC | PRN

## 2020-04-29 MED ORDER — PROPOFOL 10 MG/ML IV BOLUS
INTRAVENOUS | Status: DC | PRN
  Administered 2020-04-29: 70 mg via INTRAVENOUS

## 2020-04-29 MED ORDER — DEXAMETHASONE SODIUM PHOSPHATE 10 MG/ML IJ SOLN
INTRAMUSCULAR | Status: AC
  Filled 2020-04-29: qty 1

## 2020-04-29 MED ORDER — PROPOFOL 500 MG/50ML IV EMUL
INTRAVENOUS | Status: DC | PRN
  Administered 2020-04-29: 100 ug/kg/min via INTRAVENOUS

## 2020-04-29 MED ORDER — ORAL CARE MOUTH RINSE: 15.0000 mL | Freq: Once | OROMUCOSAL | Status: AC

## 2020-04-29 MED ORDER — DEXMEDETOMIDINE (PRECEDEX) IN NS 20 MCG/5ML (4 MCG/ML) IV SYRINGE
PREFILLED_SYRINGE | INTRAVENOUS | Status: DC | PRN
  Administered 2020-04-29: 8 ug via INTRAVENOUS

## 2020-04-29 MED ORDER — DEXAMETHASONE SODIUM PHOSPHATE 10 MG/ML IJ SOLN
INTRAMUSCULAR | Status: DC | PRN
  Administered 2020-04-29: 6 mg via INTRAVENOUS

## 2020-04-29 MED ORDER — ACETAMINOPHEN 10 MG/ML IV SOLN: 1000.0000 mg | Freq: Once | INTRAVENOUS | Status: DC | PRN

## 2020-04-29 MED ORDER — FENTANYL CITRATE (PF) 100 MCG/2ML IJ SOLN: 25.0000 ug | INTRAMUSCULAR | Status: DC | PRN

## 2020-04-29 MED ORDER — ONDANSETRON HCL 4 MG/2ML IJ SOLN
INTRAMUSCULAR | Status: AC
  Filled 2020-04-29: qty 2

## 2020-04-29 MED ORDER — CHLORHEXIDINE GLUCONATE 0.12 % MT SOLN: 15.0000 mL | Freq: Once | OROMUCOSAL | Status: AC

## 2020-04-29 MED ORDER — MIDAZOLAM HCL 2 MG/2ML IJ SOLN
INTRAMUSCULAR | Status: AC
  Filled 2020-04-29: qty 2

## 2020-04-29 MED ORDER — POVIDONE-IODINE 10 % EX SWAB: 2.0000 "application " | Freq: Once | CUTANEOUS | Status: DC

## 2020-04-29 MED ORDER — EPHEDRINE 5 MG/ML INJ
INTRAVENOUS | Status: AC
  Filled 2020-04-29: qty 10

## 2020-04-29 MED ORDER — LACTATED RINGERS IV SOLN: INTRAVENOUS | Status: DC

## 2020-04-29 MED ORDER — DEXMEDETOMIDINE (PRECEDEX) IN NS 20 MCG/5ML (4 MCG/ML) IV SYRINGE
PREFILLED_SYRINGE | INTRAVENOUS | Status: AC
  Filled 2020-04-29: qty 5

## 2020-04-29 MED ORDER — SILVER NITRATE-POT NITRATE 75-25 % EX MISC
CUTANEOUS | Status: DC | PRN
  Administered 2020-04-29: 4

## 2020-04-29 MED ORDER — SILVER NITRATE-POT NITRATE 75-25 % EX MISC
CUTANEOUS | Status: AC
  Filled 2020-04-29: qty 10

## 2020-04-29 MED ORDER — IBUPROFEN 600 MG PO TABS: 600.0000 mg | ORAL_TABLET | Freq: Four times a day (QID) | ORAL | 0 refills | Status: AC | PRN

## 2020-04-29 MED ORDER — EPHEDRINE SULFATE 50 MG/ML IJ SOLN
INTRAMUSCULAR | Status: DC | PRN
  Administered 2020-04-29 (×2): 5 mg via INTRAVENOUS

## 2020-04-29 MED ORDER — FENTANYL CITRATE (PF) 100 MCG/2ML IJ SOLN
INTRAMUSCULAR | Status: AC
  Filled 2020-04-29: qty 2

## 2020-04-29 MED ORDER — PROPOFOL 500 MG/50ML IV EMUL
INTRAVENOUS | Status: AC
  Filled 2020-04-29: qty 50

## 2020-04-29 MED ORDER — FAMOTIDINE 20 MG PO TABS: 20.0000 mg | ORAL_TABLET | Freq: Once | ORAL | Status: DC

## 2020-04-29 SURGICAL SUPPLY — 23 items
BAG URINE DRAIN 2000ML AR STRL (UROLOGICAL SUPPLIES) IMPLANT
CATH FOLEY 2WAY  5CC 16FR (CATHETERS)
CATH ROBINSON RED A/P 16FR (CATHETERS) ×2 IMPLANT
CATH URTH 16FR FL 2W BLN LF (CATHETERS) IMPLANT
COVER WAND RF STERILE (DRAPES) ×2 IMPLANT
FILTER UTR ASPR SPEC (MISCELLANEOUS) ×1 IMPLANT
FLTR UTR ASPR SPEC (MISCELLANEOUS) ×2
GLOVE SURG ENC MOIS LTX SZ7 (GLOVE) ×2 IMPLANT
GOWN STRL REUS W/ TWL LRG LVL3 (GOWN DISPOSABLE) ×2 IMPLANT
GOWN STRL REUS W/TWL LRG LVL3 (GOWN DISPOSABLE) ×2
KIT BERKELEY 1ST TRIMESTER 3/8 (MISCELLANEOUS) ×2 IMPLANT
KIT TURNOVER CYSTO (KITS) ×2 IMPLANT
MANIFOLD NEPTUNE II (INSTRUMENTS) ×2 IMPLANT
NS IRRIG 500ML POUR BTL (IV SOLUTION) ×2 IMPLANT
PACK DNC HYST (MISCELLANEOUS) ×2 IMPLANT
PAD OB MATERNITY 4.3X12.25 (PERSONAL CARE ITEMS) ×2 IMPLANT
PAD PREP 24X41 OB/GYN DISP (PERSONAL CARE ITEMS) ×2 IMPLANT
SET BERKELEY SUCTION TUBING (SUCTIONS) ×2 IMPLANT
TOWEL OR 17X26 4PK STRL BLUE (TOWEL DISPOSABLE) ×2 IMPLANT
VACURETTE 10 RIGID CVD (CANNULA) IMPLANT
VACURETTE 12 RIGID CVD (CANNULA) IMPLANT
VACURETTE 6 ASPIR F TIP BERK (CANNULA) ×1 IMPLANT
VACURETTE 8 RIGID CVD (CANNULA) IMPLANT

## 2020-04-29 NOTE — Transfer of Care (Signed)
Immediate Anesthesia Transfer of Care Note  Patient: Christie Cannon  Procedure(s) Performed: Suction D&C (N/A )  Patient Location: PACU  Anesthesia Type:General  Level of Consciousness: awake and alert   Airway & Oxygen Therapy: Patient connected to face mask oxygen  Post-op Assessment: Post -op Vital signs reviewed and stable  Post vital signs: stable  Last Vitals:  Vitals Value Taken Time  BP 100/50 04/29/20 0954  Temp    Pulse 65 04/29/20 0955  Resp 14 04/29/20 0955  SpO2 100 % 04/29/20 0955  Vitals shown include unvalidated device data.  Last Pain:  Vitals:   04/29/20 0745  TempSrc: Temporal  PainSc: 0-No pain         Complications: No complications documented.

## 2020-04-29 NOTE — Op Note (Signed)
Operative Note   04/29/2020  PRE-OP DIAGNOSIS: missed abortion [O02.1]   POST-OP DIAGNOSIS: missed abortion [O02.1]   SURGEON: Surgeon(s) and Role:    Conard Novak, MD - Primary  PROCEDURE: Procedure(s): Suction dilation and curettage  ANESTHESIA: Monitor Anesthesia Care   ESTIMATED BLOOD LOSS: 100 mL  DRAINS: none   TOTAL IV FLUIDS: 800 mL  SPECIMENS:  Endometrial curetting / products of conception  VTE PROPHYLAXIS: SCDs to the bilateral lower extremities  ANTIBIOTICS: Doxycycline 200 mg IV prior to surgery  COMPLICATIONS: none  DISPOSITION: PACU - hemodynamically stable.  CONDITION: stable  INDICATION: 40 y.o. [id number] female who had previously been diagnosed with a missed abortion. She has attempted conservative management. She was diagnosed in December and did have some bleeding. However, an ultrasound on 04/21/2020 showed a small gestational sac still present. She has been offered medication for treatment and declines.  FINDINGS: Exam under anesthesia revealed small, mobile anteverted uterus with no masses and bilateral adnexa without masses or fullness. Hysteroscopy revealed a grossly normal appearing uterine cavity with bilateral tubal ostia and normal appearing endocervical canal.  PROCEDURE IN DETAIL:  After informed consent was confirmed, the patient was taken to the operating room where general anesthesia was induced.  Her legs were carefully placed in the candy cane stirrups.  She was prepped and draped in the standard fashion.  A speculum was placed in the vagina.   A tenaculum was placed on the anterior lip of the cervix.  The cervix was serially dilated to 7 mm using Hegar dilators.  The 6 mm flexible suction curette was advanced to the uterine fundus.  Two passes were made with the suction curette with evacuation of adequate tissue noted.  Two passes were made using sharp curettage until a gritty texture was noted.  One final pass was made with the suction  curette.  All instruments were removed from the vagina and the cervix was noted to be hemostatic after removal of the tenaculum and application of silver nitrate to the tenaculum entry sites.  The patient tolerated the procedure well. For VTE prophylaxis she had SCDs on throughout the case. For antibiotic prophylaxis she had doxycycline 200 mg IV prior to the procedure.  She was awaked in the OR and taken to the PACU in stable condition.   Conard Novak, MD, FACOG 04/29/2020 9:49 AM

## 2020-04-29 NOTE — H&P (Signed)
Preoperative History and Physical  Christie Cannon is a 40 y.o. [phone] here for surgical management of missed abortion.   No significant preoperative concerns.  History of Present Illness: 40 y.o. [phone] female who had previously been diagnosed with a missed abortion. She has attempted conservative management. She was diagnosed in December and did have some bleeding. However, an ultrasound on 04/21/2020 showed a small gestational sac still present. She has been offered medication for treatment and declines. She notes no significant vaginal bleeding in a while.  She denies fevers, chills, abdominal tenderness, and abnormal discharge.   Proposed surgery: Suction, dilation and curettage  Past Medical History:  Diagnosis Date  . Abnormal Pap smear of cervix   . Anxiety   . Headache    MIGRAINE  . Irregular heart beat    IN COLLEGE-CAFFINE AND SUDAFED MAKES IT WORSE  . Pneumonia    2013   Past Surgical History:  Procedure Laterality Date  . CESAREAN SECTION  2014  . CESAREAN SECTION N/A 07/15/2015   Procedure: CESAREAN SECTION;  Surgeon: Conard Novak, MD;  Location: [facility] ORS;  Service: Obstetrics;  Laterality: N/A;  . INGUINAL HERNIA REPAIR Left 04/23/2017   PRIMARY REPAIR LEFT INDIRECT INGUINAL HENRIA:  Surgeon: Earline Mayotte, MD;  Location: [facility] ORS;  Service: General;  Laterality: Left;   OB History  Gravida Para Term Preterm AB Living  4 3 3     3   SAB IAB Ectopic Multiple Live Births        0 3    # Outcome Date GA Lbr Len/2nd Weight Sex Delivery Anes PTL Lv  4 Current           3 Term 07/15/15 [redacted]w[redacted]d  3870 g M CS-LTranv Spinal  LIV  2 Term      CS-LTranv   LIV  1 Term      Vag-Spont   LIV     Complications: Third degree perineal laceration during delivery    Obstetric Comments  1st Menstrual Cycle:  13   1st Pregnancy:  19  Patient denies any other pertinent gynecologic issues.   No current facility-administered medications on file prior to encounter.   Current  Outpatient Medications on File Prior to Encounter  Medication Sig Dispense Refill  . Cholecalciferol (VITAMIN D3) 50 MCG (2000 UT) TABS Take 2,000 Units by mouth at bedtime.    . Cyanocobalamin (VITAMIN B-12 PO) Take 1 tablet by mouth at bedtime.    . docusate sodium (COLACE) 100 MG capsule Take 100 mg by mouth at bedtime.    19 escitalopram (LEXAPRO) 10 MG tablet TAKE 1 TABLET BY MOUTH EVERYDAY AT BEDTIME (Patient taking differently: Take 15 mg by mouth at bedtime.) 90 tablet 1  . Nutritional Supplements (JUICE PLUS FIBRE PO) Take 8 tablets by mouth daily. TAKE 2 TABLETS OF EACH VARIATION:OMEGA BLEND, FRUIT BLEND, VEGETABLE BLEND & BERRY BLEND    . Probiotic Product (PROBIOTIC PO) Take by mouth.     Allergies  Allergen Reactions  . Penicillins Rash    Social History:   reports that she quit smoking about 11 years ago. Her smoking use included cigarettes. She has a 5.00 pack-year smoking history. She has never used smokeless tobacco. She reports current alcohol use of about 6.0 standard drinks of alcohol per week. She reports that she does not use drugs.  Family History  Problem Relation Age of Onset  . Esophageal cancer Father   . Breast cancer Maternal Grandmother   .  Colon cancer Maternal Grandmother     Review of Systems: Noncontributory  PHYSICAL EXAM: Blood pressure 109/61, pulse 74, temperature 98 F (36.7 C), temperature source Temporal, resp. rate 18, height 5\' 6"  (1.676 m), weight 63.5 kg, last menstrual period 01/20/2020, SpO2 100 %, unknown if currently breastfeeding. CONSTITUTIONAL: Well-developed, well-nourished female in no acute distress.  HENT:  Normocephalic, atraumatic, External right and left ear normal. Oropharynx is clear and moist EYES: Conjunctivae and EOM are normal. Pupils are equal, round, and reactive to light. No scleral icterus.  NECK: Normal range of motion, supple, no masses SKIN: Skin is warm and dry. No rash noted. Not diaphoretic. No erythema. No  pallor. NEUROLGIC: Alert and oriented to person, place, and time. Normal reflexes, muscle tone coordination. No cranial nerve deficit noted. PSYCHIATRIC: Normal mood and affect. Normal behavior. Normal judgment and thought content. CARDIOVASCULAR: Normal heart rate noted, regular rhythm RESPIRATORY: Effort and breath sounds normal, no problems with respiration noted ABDOMEN: Soft, nontender, nondistended. PELVIC: Deferred MUSCULOSKELETAL: Normal range of motion. No edema and no tenderness. 2+ distal pulses.  Labs: Results for orders placed or performed during the hospital encounter of 04/27/20 (from the past 336 hour(s))  Type and screen [facility]   Collection Time: 04/27/20  1:12 PM  Result Value Ref Range   ABO/RH(D) O POS    Antibody Screen NEG    Sample Expiration 04/30/2020,2359    Extend sample reason      PREGNANT WITHIN 3 MONTHS, UNABLE TO EXTEND Performed at [facility], [address] [place], [place]   SARS CORONAVIRUS 2 (TAT 6-24 HRS) Nasopharyngeal Nasopharyngeal Swab   Collection Time: 04/27/20  2:38 PM   Specimen: Nasopharyngeal Swab  Result Value Ref Range   SARS Coronavirus 2 NEGATIVE NEGATIVE    Imaging Studies: 06/25/20 OB Transvaginal  Result Date: 04/21/2020 Patient Name: Christie Cannon DOB: Apr 20, 1980 MRN: [id number] ULTRASOUND REPORT Location: [facility] Date of Service: 04/21/2020 Indications:missed AB Findings: No viable intrauterine pregnancy visualized. Gestational sac seen measuring 9.108mm, [redacted]w[redacted]d. No yolk sac/fetal pole seen. Right Ovary is normal in appearance. Left Ovary is normal appearance. Survey of the adnexa demonstrates no adnexal masses. There is no free peritoneal fluid in the cul de sac. Impression: 1. Gestational sac seen measuring [redacted]w[redacted]d without yolk sac/fetal pole. [redacted]w[redacted]d, RT The ultrasound images and findings were reviewed by me and I agree with the above report. Darlina Guys, MD, Thomas[facility]air  OB/GYN, [facility] 04/21/2020 4:24 PM      Assessment: Missed abortion [O02.1]  Plan: Patient will undergo surgical management with the above-noted surgery.   The risks of surgery were discussed in detail with the patient including but not limited to: bleeding which may require transfusion or reoperation; infection which may require antibiotics; injury to surrounding organs which may involve bowel, bladder, ureters ; need for additional procedures including laparoscopy or laparotomy; thromboembolic phenomenon, surgical site problems and other postoperative/anesthesia complications. Likelihood of success in alleviating the patient's condition was discussed. Routine postoperative instructions will be reviewed with the patient and her family in detail after surgery.  The patient concurred with the proposed plan, giving informed written consent for the surgery.  Patient has been NPO since last night she will remain NPO for procedure.  Anesthesia and OR aware.  Preoperative prophylactic antibiotics, as indicated, and SCDs ordered on call to the OR.  To OR when ready.  06/19/2020, MD 04/29/2020 9:05 AM

## 2020-04-29 NOTE — Anesthesia Preprocedure Evaluation (Signed)
Anesthesia Evaluation  Patient identified by MRN, date of birth, ID band Patient awake    Reviewed: Allergy & Precautions, NPO status , Patient's Chart, lab work & pertinent test results  History of Anesthesia Complications Negative for: history of anesthetic complications  Airway Mallampati: I  TM Distance: >3 FB Neck ROM: Full    Dental no notable dental hx. (+) Teeth Intact   Pulmonary neg pulmonary ROS, neg sleep apnea, neg COPD, Patient abstained from smoking.Not current smoker, former smoker,    Pulmonary exam normal breath sounds clear to auscultation       Cardiovascular Exercise Tolerance: Good METS(-) hypertension(-) CAD and (-) Past MI negative cardio ROS  (-) dysrhythmias  Rhythm:Regular Rate:Normal - Systolic murmurs    Neuro/Psych  Headaches, PSYCHIATRIC DISORDERS Anxiety Depression    GI/Hepatic neg GERD  ,(+)     (-) substance abuse  ,   Endo/Other  neg diabetes  Renal/GU negative Renal ROS     Musculoskeletal   Abdominal   Peds  Hematology   Anesthesia Other Findings Past Medical History: No date: Abnormal Pap smear of cervix No date: Anxiety No date: Headache     Comment:  MIGRAINE No date: Irregular heart beat     Comment:  IN COLLEGE-CAFFINE AND SUDAFED MAKES IT WORSE No date: Pneumonia     Comment:  2013  Reproductive/Obstetrics 5 week incomplete abortion                            Anesthesia Physical Anesthesia Plan  ASA: II  Anesthesia Plan: General   Post-op Pain Management:    Induction: Intravenous  PONV Risk Score and Plan: 3 and Ondansetron, Propofol infusion, TIVA, Midazolam and Dexamethasone  Airway Management Planned: Nasal Cannula and Natural Airway  Additional Equipment: None  Intra-op Plan:   Post-operative Plan:   Informed Consent: I have reviewed the patients History and Physical, chart, labs and discussed the procedure including  the risks, benefits and alternatives for the proposed anesthesia with the patient or authorized representative who has indicated his/her understanding and acceptance.     Dental advisory given  Plan Discussed with: CRNA and Surgeon  Anesthesia Plan Comments: (Discussed risks of anesthesia with patient, including possibility of difficulty with spontaneous ventilation under anesthesia necessitating airway intervention, PONV, and rare risks such as cardiac or respiratory or neurological events. Patient understands.)        Anesthesia Quick Evaluation

## 2020-04-29 NOTE — Discharge Instructions (Signed)
Dilation and Curettage or Vacuum Curettage, Care After This sheet gives you information about how to care for yourself after your procedure. Your doctor may also give you more specific instructions. If you have problems or questions, contact your doctor. What can I expect after the procedure? After the procedure, it is common to have:  Mild pain or cramping.  Some bleeding or spotting from the vagina. These may last for up to 2 weeks. Follow these instructions at home: Medicines  Take over-the-counter and prescription medicines only as told by your doctor. This is very important if you take blood-thinning medicine.  Ask your doctor if the medicine prescribed to you requires you to avoid driving or using machinery. Activity  If you were given a medicine to help you relax (sedative) during your procedure, it can affect you for many hours. Do not drive or use machinery until your doctor says that it is safe.  Rest as told by your doctor.  Do not sit for a long time without moving. Get up to take short walks every 1-2 hours. This is important. Ask for help if you feel weak or unsteady.  Do not lift anything that is heavier than 10 lb (4.5 kg), or the limit that you are told, until your doctor says that it is safe.  Return to your normal activities as told by your doctor. Ask your doctor what activities are safe for you.   Lifestyle For at least 2 weeks, or as long as told by your doctor:  Do not douche.  Do not use tampons.  Do not have sex. General instructions  Wear compression stockings as told by your doctor.  It is up to you to get the results of your procedure. Ask your doctor, or the department that is doing the procedure, when your results will be ready.  Keep all follow-up visits as told by your doctor. This is important. Contact a doctor if:  You have very bad cramps that get worse or do not get better with medicine.  You have very bad pain in your belly  (abdomen).  You cannot drink fluids without vomiting.  You have pain in a different part of your pelvis. The pelvis is the area just above your thighs.  You have fluid from your vagina that smells bad.  You have a rash. Get help right away if:  You are bleeding a lot from your vagina. A lot of bleeding means soaking more than one sanitary pad in 1 hour for 2 hours in a row.  You have a fever that is above 100.4F (38.0C).  Your belly feels very tender or hard.  You have chest pain.  You have trouble breathing.  You feel dizzy.  You feel light-headed.  You pass out (faint).  You have pain in your neck or shoulder area. These symptoms may be an emergency. Do not wait to see if the symptoms will go away. Get medical help right away. Call your local emergency services (911 in the [place] Do not drive yourself to the hospital. Summary  After your procedure, it is common to have pain or cramping. It is also common to have bleeding or spotting from your vagina.  Rest as told. Do not sit for a long time without moving. Get up to take short walks every 1-2 hours.  Do not lift anything that is heavier than 10 lb (4.5 kg), or the limit that you are told.  Contact your doctor if you have fluid from   your vagina that smells bad.  Get help right away if you develop any problems from the procedure. Ask your doctor what problems to watch for. This information is not intended to replace advice given to you by your health care provider. Make sure you discuss any questions you have with your health care provider. Document Revised: 04/08/2019 Document Reviewed: 04/08/2019 Elsevier Patient Education  2021 [employer]   AMBULATORY SURGERY  DISCHARGE INSTRUCTIONS   1) The drugs that you were given will stay in your system until tomorrow so for the next 24 hours you should not:  A) Drive an automobile B) Make any legal decisions C) Drink any alcoholic beverage   2) You may resume  regular meals tomorrow.  Today it is better to start with liquids and gradually work up to solid foods.  You may eat anything you prefer, but it is better to start with liquids, then soup and crackers, and gradually work up to solid foods.   3) Please notify your doctor immediately if you have any unusual bleeding, trouble breathing, redness and pain at the surgery site, drainage, fever, or pain not relieved by medication.    4) Additional Instructions:    Please contact your physician with any problems or Same Day Surgery at [phone], Monday through Friday 6 am to 4 pm, or [facility] [place] Main number at [phone]. 

## 2020-04-29 NOTE — Anesthesia Postprocedure Evaluation (Signed)
Anesthesia Post Note  Patient: Christie Cannon  Procedure(s) Performed: Suction D&C (N/A )  Patient location during evaluation: PACU Anesthesia Type: General Level of consciousness: awake and alert Pain management: pain level controlled Vital Signs Assessment: post-procedure vital signs reviewed and stable Respiratory status: spontaneous breathing, nonlabored ventilation and respiratory function stable Cardiovascular status: blood pressure returned to baseline and stable Postop Assessment: no apparent nausea or vomiting Anesthetic complications: no   No complications documented.   Last Vitals:  Vitals:   04/29/20 1040 04/29/20 1110  BP: (!) 109/53 110/69  Pulse: 64 66  Resp: 16 16  Temp: (!) 36.1 C (!) 36.1 C  SpO2: 100% 100%    Last Pain:  Vitals:   04/29/20 1110  TempSrc: Temporal  PainSc: 0-No pain                 [name]

## 2020-04-30 ENCOUNTER — Encounter: Payer: Self-pay | Admitting: Obstetrics and Gynecology

## 2020-04-30 LAB — SURGICAL PATHOLOGY

## 2020-05-13 ENCOUNTER — Ambulatory Visit: Payer: BC Managed Care – PPO | Admitting: Obstetrics and Gynecology

## 2020-05-14 ENCOUNTER — Ambulatory Visit: Payer: BC Managed Care – PPO | Admitting: Obstetrics and Gynecology

## 2020-05-14 ENCOUNTER — Ambulatory Visit (INDEPENDENT_AMBULATORY_CARE_PROVIDER_SITE_OTHER): Payer: BC Managed Care – PPO | Admitting: Obstetrics and Gynecology

## 2020-05-14 ENCOUNTER — Other Ambulatory Visit: Payer: Self-pay

## 2020-05-14 ENCOUNTER — Encounter: Payer: Self-pay | Admitting: Obstetrics and Gynecology

## 2020-05-14 VITALS — BP 126/84 | Ht 66.0 in | Wt 146.0 lb

## 2020-05-14 DIAGNOSIS — Z09 Encounter for follow-up examination after completed treatment for conditions other than malignant neoplasm: Secondary | ICD-10-CM | POA: Diagnosis not present

## 2020-05-14 DIAGNOSIS — Z30430 Encounter for insertion of intrauterine contraceptive device: Secondary | ICD-10-CM

## 2020-05-14 MED ORDER — LEVONORGESTREL 20 MCG/24HR IU IUD: 1.0000 | INTRAUTERINE_SYSTEM | Freq: Once | INTRAUTERINE | 0 refills | Status: AC

## 2020-05-14 NOTE — Progress Notes (Signed)
   Postoperative Follow-up Patient presents post op from [facility] 2 weeks ago for missed abortion.  Subjective: Patient reports marked improvement in her preop symptoms. Eating a regular diet without difficulty. The patient is not having any pain.  Activity: normal activities of daily living.  She denies fevers, chills, nausea, vomiting, abdominal pain. She has had mild spotting. She would like an IUD.   Objective: Vitals:   05/14/20 1359  BP: 126/84   Vital Signs: BP 126/84   Ht 5\' 6"  (1.676 m)   Wt 146 lb (66.2 kg)   LMP 01/20/2020   BMI 23.57 kg/m  Constitutional: Well nourished, well developed female in no acute distress.  HEENT: normal Skin: Warm and dry.  Extremity: no edema  Abdomen: Soft, non-tender, normal bowel sounds; no bruits, organomegaly or masses.   Pelvic exam: (female chaperone present) is not limited by body habitus EGBUS: within normal limits Vagina: within normal limits and with without blood in the vault Cervix: normal  IUD Insertion Procedure Note (Mirena) Patient identified, informed consent performed, consent signed.   Discussed risks of irregular bleeding, cramping, infection, malpositioning, expulsion or uterine perforation of the IUD (1:1000 placements)  which may require further procedure such as laparoscopy.  IUD while effective at preventing pregnancy do not prevent transmission of sexually transmitted diseases and use of barrier methods for this purpose was discussed. Time out was performed.  Urine pregnancy test negative.  Speculum placed in the vagina.  Cervix visualized.  Cleaned with Betadine x 2.  Grasped anteriorly with a single tooth tenaculum.  Uterus sounded to 9 cm. IUD placed per manufacturer's recommendations.  Strings trimmed to 3 cm. Tenaculum was removed, good hemostasis noted.  Patient tolerated procedure well.   Patient was given post-procedure instructions.  She was advised to have backup contraception for one week.  Patient was also  asked to check IUD strings periodically and follow up in 4 weeks for IUD check.    Female chaperone present for pelvic exam:   Assessment: 40 y.o. s/p D&C and placement of IUD today progressing well  Plan: Patient has done well after surgery with no apparent complications.  I have discussed the post-operative course to date, and the expected progress moving forward.  The patient understands what complications to be concerned about.  I will see the patient in routine follow up, or sooner if needed.    Activity plan: No restriction.  Return in about 4 weeks (around 06/11/2020) for IUD String Check.   06/13/2020, MD 05/14/2020, 2:01 PM

## 2020-06-14 ENCOUNTER — Ambulatory Visit: Payer: BC Managed Care – PPO | Admitting: Obstetrics and Gynecology

## 2020-06-15 ENCOUNTER — Ambulatory Visit (INDEPENDENT_AMBULATORY_CARE_PROVIDER_SITE_OTHER): Payer: BC Managed Care – PPO | Admitting: Obstetrics and Gynecology

## 2020-06-15 ENCOUNTER — Encounter: Payer: Self-pay | Admitting: Obstetrics and Gynecology

## 2020-06-15 ENCOUNTER — Other Ambulatory Visit: Payer: Self-pay

## 2020-06-15 VITALS — BP 118/74 | Ht 64.0 in

## 2020-06-15 DIAGNOSIS — Z30431 Encounter for routine checking of intrauterine contraceptive device: Secondary | ICD-10-CM | POA: Diagnosis not present

## 2020-06-15 NOTE — Progress Notes (Signed)
   IUD String Check  Subjctive: [name] presents for IUD string check.  She had a Mirena placed 4 weeks ago.  Since placement of her IUD she had occasional vaginal bleeding after intercourse.  She denies cramping or discomfort.  She has had intercourse since placement.  She has not checked the strings.  She denies any fever, chills, nausea, vomiting, or other complaints.    Objective: BP 118/74   Ht 5\' 4"  (1.626 m)   LMP 01/20/2020   BMI 25.06 kg/m  Physical Exam Constitutional:      General: She is not in acute distress.    Appearance: Normal appearance.  Genitourinary:     Bladder and urethral meatus normal.     No lesions in the vagina.     Right Labia: No rash, tenderness, lesions or skin changes.    Left Labia: No tenderness, lesions, skin changes or rash.    No inguinal adenopathy present in the right or left side.    Pelvic Tanner Score: 5/5.    No vaginal discharge, erythema, bleeding or ulceration.     No vaginal prolapse present.     Right Adnexa: not tender, not full and no mass present.    Left Adnexa: not tender, not full and no mass present.    No cervical motion tenderness, discharge, friability, lesion or polyp.     IUD strings visualized.     Uterus is not enlarged, fixed or tender.     Uterus is anteverted.  HENT:     Head: Normocephalic and atraumatic.  Eyes:     General: No scleral icterus.    Conjunctiva/sclera: Conjunctivae normal.  Lymphadenopathy:     Lower Body: No right inguinal adenopathy. No left inguinal adenopathy.  Neurological:     General: No focal deficit present.     Mental Status: She is alert and oriented to person, place, and time.     Cranial Nerves: No cranial nerve deficit.  Psychiatric:        Mood and Affect: Mood normal.        Behavior: Behavior normal.        Judgment: Judgment normal.     Female chaperone was present for the entirety of the pelvic exam  Assessment: 40 y.o. year old female status post prior  Mirena IUD placement 4 week ago, doing well.  Plan: 1.  The patient was given instructions to check her IUD strings monthly and call with any problems or concerns.  She should call for fevers, chills, abnormal vaginal discharge, pelvic pain, or other complaints. 2.  She will return for a annual exam in 1 year.  All questions answered.  20 minutes spent in face to face discussion with > 50% spent in counseling, management, and coordination of care for her newly-placed IUD.  Risks and benefits of IUD discussed including the risks of irregular bleeding, cramping, infection, malpositioning, expulsion, which may require further procedures such as laparoscopy.  IUDs while effective at preventing pregnancy do not prevent transmission of sexually transmitted diseases and use of barrier methods for this purpose was discussed.  Low overall incidence of failure with 99.7% efficacy rate in typical use.    20, MD 06/15/2020 4:35 PM

## 2021-02-18 ENCOUNTER — Ambulatory Visit: Payer: BC Managed Care – PPO | Admitting: Obstetrics and Gynecology

## 2021-10-27 ENCOUNTER — Other Ambulatory Visit: Payer: Self-pay | Admitting: Obstetrics and Gynecology

## 2021-10-27 DIAGNOSIS — Z1231 Encounter for screening mammogram for malignant neoplasm of breast: Secondary | ICD-10-CM

## 2021-12-05 ENCOUNTER — Ambulatory Visit
Admission: RE | Admit: 2021-12-05 | Discharge: 2021-12-05 | Disposition: A | Discharge status: Home / Self Care | Payer: BC Managed Care – PPO | Source: Ambulatory Visit | Attending: Obstetrics and Gynecology | Admitting: Obstetrics and Gynecology

## 2021-12-05 DIAGNOSIS — Z1231 Encounter for screening mammogram for malignant neoplasm of breast: Secondary | ICD-10-CM | POA: Diagnosis not present

## 2022-07-28 ENCOUNTER — Other Ambulatory Visit: Payer: Self-pay

## 2022-07-28 DIAGNOSIS — R1032 Left lower quadrant pain: Secondary | ICD-10-CM

## 2022-08-02 ENCOUNTER — Ambulatory Visit
Admission: RE | Admit: 2022-08-02 | Discharge: 2022-08-02 | Disposition: A | Discharge status: Home / Self Care | Payer: BC Managed Care – PPO | Source: Ambulatory Visit | Attending: Family Medicine | Admitting: Family Medicine

## 2022-08-02 DIAGNOSIS — R1032 Left lower quadrant pain: Secondary | ICD-10-CM | POA: Diagnosis present

## 2022-08-02 MED ORDER — IOHEXOL 300 MG/ML  SOLN
100.0000 mL | Freq: Once | INTRAMUSCULAR | Status: AC | PRN
  Administered 2022-08-02: 100 mL via INTRAVENOUS

## 2023-01-04 ENCOUNTER — Ambulatory Visit: Payer: BC Managed Care – PPO

## 2023-01-04 DIAGNOSIS — R1032 Left lower quadrant pain: Secondary | ICD-10-CM

## 2023-01-04 DIAGNOSIS — K449 Diaphragmatic hernia without obstruction or gangrene: Secondary | ICD-10-CM

## 2023-01-05 ENCOUNTER — Other Ambulatory Visit: Payer: Self-pay | Admitting: Internal Medicine

## 2023-01-05 DIAGNOSIS — Z1231 Encounter for screening mammogram for malignant neoplasm of breast: Secondary | ICD-10-CM

## 2023-01-19 ENCOUNTER — Encounter: Payer: Self-pay | Admitting: Radiology

## 2023-01-19 ENCOUNTER — Ambulatory Visit
Admission: RE | Admit: 2023-01-19 | Discharge: 2023-01-19 | Disposition: A | Discharge status: Home / Self Care | Payer: BC Managed Care – PPO | Source: Ambulatory Visit | Attending: Internal Medicine | Admitting: Internal Medicine

## 2023-01-19 DIAGNOSIS — Z1231 Encounter for screening mammogram for malignant neoplasm of breast: Secondary | ICD-10-CM | POA: Insufficient documentation

## 2023-04-20 ENCOUNTER — Other Ambulatory Visit: Payer: Self-pay | Admitting: Internal Medicine

## 2023-04-20 DIAGNOSIS — M5416 Radiculopathy, lumbar region: Secondary | ICD-10-CM

## 2023-04-27 ENCOUNTER — Ambulatory Visit: Admission: RE | Admit: 2023-04-27 | Payer: 59 | Source: Ambulatory Visit

## 2023-10-08 ENCOUNTER — Other Ambulatory Visit: Payer: Self-pay | Admitting: Internal Medicine

## 2023-10-08 DIAGNOSIS — Z1231 Encounter for screening mammogram for malignant neoplasm of breast: Secondary | ICD-10-CM

## 2024-01-21 ENCOUNTER — Ambulatory Visit
Admission: RE | Admit: 2024-01-21 | Discharge: 2024-01-21 | Disposition: A | Discharge status: Home / Self Care | Source: Ambulatory Visit | Attending: Internal Medicine | Admitting: Internal Medicine

## 2024-01-21 DIAGNOSIS — Z1231 Encounter for screening mammogram for malignant neoplasm of breast: Secondary | ICD-10-CM | POA: Insufficient documentation
